# Patient Record
Sex: Male | Born: 1960
Health system: Southern US, Community
[De-identification: ages and names within clinical notes are randomized; demographics above are authoritative.]

## PROBLEM LIST (undated history)

## (undated) DIAGNOSIS — M1990 Unspecified osteoarthritis, unspecified site: Secondary | ICD-10-CM

## (undated) DIAGNOSIS — I10 Essential (primary) hypertension: Secondary | ICD-10-CM

## (undated) DIAGNOSIS — J302 Other seasonal allergic rhinitis: Secondary | ICD-10-CM

## (undated) DIAGNOSIS — Z9889 Other specified postprocedural states: Secondary | ICD-10-CM

## (undated) DIAGNOSIS — N200 Calculus of kidney: Secondary | ICD-10-CM

## (undated) DIAGNOSIS — K649 Unspecified hemorrhoids: Secondary | ICD-10-CM

## (undated) DIAGNOSIS — F32A Depression, unspecified: Secondary | ICD-10-CM

## (undated) DIAGNOSIS — K219 Gastro-esophageal reflux disease without esophagitis: Secondary | ICD-10-CM

## (undated) DIAGNOSIS — I493 Ventricular premature depolarization: Secondary | ICD-10-CM

## (undated) DIAGNOSIS — R112 Nausea with vomiting, unspecified: Secondary | ICD-10-CM

## (undated) DIAGNOSIS — F419 Anxiety disorder, unspecified: Secondary | ICD-10-CM

## (undated) DIAGNOSIS — F329 Major depressive disorder, single episode, unspecified: Secondary | ICD-10-CM

## (undated) HISTORY — DX: Calculus of kidney: N20.0

## (undated) HISTORY — DX: Other seasonal allergic rhinitis: J30.2

## (undated) HISTORY — DX: Essential (primary) hypertension: I10

## (undated) HISTORY — DX: Ventricular premature depolarization: I49.3

## (undated) HISTORY — DX: Unspecified hemorrhoids: K64.9

---

## 1988-09-09 HISTORY — PX: HERNIA REPAIR: SHX51

## 1992-09-09 DIAGNOSIS — N200 Calculus of kidney: Secondary | ICD-10-CM

## 1992-09-09 HISTORY — DX: Calculus of kidney: N20.0

## 1997-09-09 HISTORY — PX: SHOULDER SURGERY: SHX246

## 2001-03-24 ENCOUNTER — Encounter: Admission: RE | Admit: 2001-03-24 | Discharge: 2001-04-08 | Payer: Self-pay | Admitting: *Deleted

## 2001-06-03 ENCOUNTER — Ambulatory Visit: Admission: RE | Admit: 2001-06-03 | Discharge: 2001-09-01 | Payer: Self-pay | Admitting: Radiation Oncology

## 2001-09-09 HISTORY — PX: COLONOSCOPY: SHX174

## 2012-05-21 ENCOUNTER — Other Ambulatory Visit: Payer: Self-pay | Admitting: Orthopedic Surgery

## 2012-05-21 DIAGNOSIS — M25532 Pain in left wrist: Secondary | ICD-10-CM

## 2012-06-01 ENCOUNTER — Ambulatory Visit
Admission: RE | Admit: 2012-06-01 | Discharge: 2012-06-01 | Disposition: A | Discharge status: Home / Self Care | Payer: BC Managed Care – PPO | Source: Ambulatory Visit | Attending: Orthopedic Surgery | Admitting: Orthopedic Surgery

## 2012-06-01 DIAGNOSIS — M25532 Pain in left wrist: Secondary | ICD-10-CM

## 2012-06-01 MED ORDER — IOHEXOL 180 MG/ML  SOLN
3.0000 mL | Freq: Once | INTRAMUSCULAR | Status: AC | PRN
  Administered 2012-06-01: 3 mL via INTRA_ARTICULAR

## 2012-06-25 ENCOUNTER — Encounter: Payer: Self-pay | Admitting: Gastroenterology

## 2012-12-30 ENCOUNTER — Encounter: Payer: Self-pay | Admitting: Gastroenterology

## 2013-01-25 ENCOUNTER — Encounter: Payer: Self-pay | Admitting: Gastroenterology

## 2013-03-04 ENCOUNTER — Encounter: Payer: Self-pay | Admitting: Gastroenterology

## 2013-03-04 ENCOUNTER — Ambulatory Visit (AMBULATORY_SURGERY_CENTER): Payer: 59 | Admitting: *Deleted

## 2013-03-04 VITALS — Ht 70.0 in | Wt 184.2 lb

## 2013-03-04 DIAGNOSIS — Z1211 Encounter for screening for malignant neoplasm of colon: Secondary | ICD-10-CM

## 2013-03-04 MED ORDER — NA SULFATE-K SULFATE-MG SULF 17.5-3.13-1.6 GM/177ML PO SOLN: 1.0000 | Freq: Once | ORAL | Status: DC

## 2013-03-04 NOTE — Progress Notes (Signed)
Denies allergies to eggs or soy products. Denies complications with sedation. States he has had pretty severe nausea with anesthesia.

## 2013-03-19 ENCOUNTER — Encounter: Payer: BC Managed Care – PPO | Admitting: Gastroenterology

## 2013-03-25 ENCOUNTER — Ambulatory Visit (AMBULATORY_SURGERY_CENTER): Payer: 59 | Admitting: Gastroenterology

## 2013-03-25 ENCOUNTER — Encounter: Payer: Self-pay | Admitting: Gastroenterology

## 2013-03-25 VITALS — BP 107/76 | HR 71 | Temp 97.2°F | Resp 13 | Ht 70.0 in | Wt 184.0 lb

## 2013-03-25 DIAGNOSIS — Z1211 Encounter for screening for malignant neoplasm of colon: Secondary | ICD-10-CM

## 2013-03-25 MED ORDER — SODIUM CHLORIDE 0.9 % IV SOLN: 500.0000 mL | INTRAVENOUS | Status: DC

## 2013-03-25 NOTE — Patient Instructions (Addendum)
Discharge instructions given with verbal understanding. Normal exam. Resume previous medications. YOU HAD AN ENDOSCOPIC PROCEDURE TODAY AT THE  ENDOSCOPY CENTER: Refer to the procedure report that was given to you for any specific questions about what was found during the examination.  If the procedure report does not answer your questions, please call your gastroenterologist to clarify.  If you requested that your care partner not be given the details of your procedure findings, then the procedure report has been included in a sealed envelope for you to review at your convenience later.  YOU SHOULD EXPECT: Some feelings of bloating in the abdomen. Passage of more gas than usual.  Walking can help get rid of the air that was put into your GI tract during the procedure and reduce the bloating. If you had a lower endoscopy (such as a colonoscopy or flexible sigmoidoscopy) you may notice spotting of blood in your stool or on the toilet paper. If you underwent a bowel prep for your procedure, then you may not have a normal bowel movement for a few days.  DIET: Your first meal following the procedure should be a light meal and then it is ok to progress to your normal diet.  A half-sandwich or bowl of soup is an example of a good first meal.  Heavy or fried foods are harder to digest and may make you feel nauseous or bloated.  Likewise meals heavy in dairy and vegetables can cause extra gas to form and this can also increase the bloating.  Drink plenty of fluids but you should avoid alcoholic beverages for 24 hours.  ACTIVITY: Your care partner should take you home directly after the procedure.  You should plan to take it easy, moving slowly for the rest of the day.  You can resume normal activity the day after the procedure however you should NOT DRIVE or use heavy machinery for 24 hours (because of the sedation medicines used during the test).    SYMPTOMS TO REPORT IMMEDIATELY: A gastroenterologist  can be reached at any hour.  During normal business hours, 8:30 AM to 5:00 PM Monday through Friday, call [phone].  After hours and on weekends, please call the GI answering service at [phone] who will take a message and have the physician on call contact you.   Following lower endoscopy (colonoscopy or flexible sigmoidoscopy):  Excessive amounts of blood in the stool  Significant tenderness or worsening of abdominal pains  Swelling of the abdomen that is new, acute  Fever of 100F or higher  FOLLOW UP: If any biopsies were taken you will be contacted by phone or by letter within the next 1-3 weeks.  Call your gastroenterologist if you have not heard about the biopsies in 3 weeks.  Our staff will call the home number listed on your records the next business day following your procedure to check on you and address any questions or concerns that you may have at that time regarding the information given to you following your procedure. This is a courtesy call and so if there is no answer at the home number and we have not heard from you through the emergency physician on call, we will assume that you have returned to your regular daily activities without incident.  SIGNATURES/CONFIDENTIALITY: You and/or your care partner have signed paperwork which will be entered into your electronic medical record.  These signatures attest to the fact that that the information above on your After Visit Summary has been reviewed   and is understood.  Full responsibility of the confidentiality of this discharge information lies with you and/or your care-partner. 

## 2013-03-25 NOTE — Progress Notes (Signed)
Patient did not experience any of the following events: a burn prior to discharge; a fall within the facility; wrong site/side/patient/procedure/implant event; or a hospital transfer or hospital admission upon discharge from the facility. (G8907) Patient did not have preoperative order for IV antibiotic SSI prophylaxis. (G8918)  

## 2013-03-25 NOTE — Op Note (Signed)
[facility] [address] [place], [address]   COLONOSCOPY PROCEDURE REPORT  PATIENT: Dylan Marshall, Dylan Marshall.  MR#: [id number] BIRTHDATE: Jun 22, 1961 , 52  yrs. old GENDER: Male ENDOSCOPIST: Louis Meckel, MD REFERRED XB:JYNW Jeanie Sewer, M.D. PROCEDURE DATE:  03/25/2013 PROCEDURE:   Colonoscopy, diagnostic ASA CLASS:   Class I INDICATIONS:Average risk patient for colon cancer. MEDICATIONS: MAC sedation, administered by CRNA and propofol (Diprivan) 200mg  IV  DESCRIPTION OF PROCEDURE:   After the risks benefits and alternatives of the procedure were thoroughly explained, informed consent was obtained.  A digital rectal exam revealed no abnormalities of the rectum.   The LB [id number]  endoscope was introduced through the anus and advanced to the cecum, which was identified by both the appendix and ileocecal valve. No adverse events experienced.   The quality of the prep was excellent using Suprep  The instrument was then slowly withdrawn as the colon was fully examined.      COLON FINDINGS: A normal appearing cecum, ileocecal valve, and appendiceal orifice were identified.  The ascending, hepatic flexure, transverse, splenic flexure, descending, sigmoid colon and rectum appeared unremarkable.  No polyps or cancers were seen. Retroflexed views revealed no abnormalities. The time to cecum=3 minutes 12 seconds.  Withdrawal time=7 minutes 34 seconds.  The scope was withdrawn and the procedure completed. COMPLICATIONS: There were no complications.  ENDOSCOPIC IMPRESSION: Normal colon  RECOMMENDATIONS: Continue current colorectal screening recommendations for "routine risk" patients with a repeat colonoscopy in 10 years.   eSigned:  Louis Meckel, MD 03/25/2013 8:29 AM   cc:

## 2013-03-26 ENCOUNTER — Telehealth: Payer: Self-pay

## 2013-03-26 NOTE — Telephone Encounter (Signed)
Left message on answering machine. 

## 2015-03-22 ENCOUNTER — Other Ambulatory Visit: Payer: Self-pay | Admitting: Orthopaedic Surgery

## 2015-03-22 DIAGNOSIS — M25512 Pain in left shoulder: Secondary | ICD-10-CM

## 2015-04-03 ENCOUNTER — Ambulatory Visit
Admission: RE | Admit: 2015-04-03 | Discharge: 2015-04-03 | Disposition: A | Discharge status: Home / Self Care | Payer: 59 | Source: Ambulatory Visit | Attending: Orthopaedic Surgery | Admitting: Orthopaedic Surgery

## 2015-04-03 ENCOUNTER — Ambulatory Visit
Admission: RE | Admit: 2015-04-03 | Discharge: 2015-04-03 | Disposition: A | Discharge status: Home / Self Care | Payer: Self-pay | Source: Ambulatory Visit | Attending: Orthopaedic Surgery | Admitting: Orthopaedic Surgery

## 2015-04-03 DIAGNOSIS — M25512 Pain in left shoulder: Secondary | ICD-10-CM

## 2015-04-03 MED ORDER — IOHEXOL 180 MG/ML  SOLN
13.0000 mL | Freq: Once | INTRAMUSCULAR | Status: AC | PRN
  Administered 2015-04-03: 13 mL via INTRA_ARTICULAR

## 2015-04-19 ENCOUNTER — Other Ambulatory Visit (HOSPITAL_COMMUNITY): Payer: Self-pay | Admitting: Orthopaedic Surgery

## 2015-04-19 ENCOUNTER — Other Ambulatory Visit (HOSPITAL_BASED_OUTPATIENT_CLINIC_OR_DEPARTMENT_OTHER): Payer: Self-pay | Admitting: Orthopaedic Surgery

## 2015-04-28 ENCOUNTER — Encounter (HOSPITAL_BASED_OUTPATIENT_CLINIC_OR_DEPARTMENT_OTHER): Payer: Self-pay | Admitting: *Deleted

## 2015-05-01 ENCOUNTER — Encounter (HOSPITAL_BASED_OUTPATIENT_CLINIC_OR_DEPARTMENT_OTHER)
Admission: RE | Disposition: A | Discharge status: Home / Self Care | Payer: Self-pay | Source: Ambulatory Visit | Attending: Orthopaedic Surgery

## 2015-05-01 ENCOUNTER — Ambulatory Visit (HOSPITAL_BASED_OUTPATIENT_CLINIC_OR_DEPARTMENT_OTHER)
Admission: RE | Admit: 2015-05-01 | Discharge: 2015-05-01 | Disposition: A | Discharge status: Home / Self Care | Payer: 59 | Source: Ambulatory Visit | Attending: Orthopaedic Surgery | Admitting: Orthopaedic Surgery

## 2015-05-01 ENCOUNTER — Ambulatory Visit (HOSPITAL_BASED_OUTPATIENT_CLINIC_OR_DEPARTMENT_OTHER): Payer: 59 | Admitting: Anesthesiology

## 2015-05-01 ENCOUNTER — Encounter (HOSPITAL_BASED_OUTPATIENT_CLINIC_OR_DEPARTMENT_OTHER): Payer: Self-pay

## 2015-05-01 DIAGNOSIS — X58XXXA Exposure to other specified factors, initial encounter: Secondary | ICD-10-CM | POA: Insufficient documentation

## 2015-05-01 DIAGNOSIS — Z87891 Personal history of nicotine dependence: Secondary | ICD-10-CM | POA: Diagnosis not present

## 2015-05-01 DIAGNOSIS — F419 Anxiety disorder, unspecified: Secondary | ICD-10-CM | POA: Insufficient documentation

## 2015-05-01 DIAGNOSIS — M94212 Chondromalacia, left shoulder: Secondary | ICD-10-CM | POA: Diagnosis not present

## 2015-05-01 DIAGNOSIS — M75112 Incomplete rotator cuff tear or rupture of left shoulder, not specified as traumatic: Secondary | ICD-10-CM | POA: Insufficient documentation

## 2015-05-01 DIAGNOSIS — F329 Major depressive disorder, single episode, unspecified: Secondary | ICD-10-CM | POA: Diagnosis not present

## 2015-05-01 DIAGNOSIS — S43432A Superior glenoid labrum lesion of left shoulder, initial encounter: Secondary | ICD-10-CM | POA: Diagnosis not present

## 2015-05-01 DIAGNOSIS — K219 Gastro-esophageal reflux disease without esophagitis: Secondary | ICD-10-CM | POA: Diagnosis not present

## 2015-05-01 DIAGNOSIS — S43492A Other sprain of left shoulder joint, initial encounter: Secondary | ICD-10-CM | POA: Diagnosis present

## 2015-05-01 DIAGNOSIS — M19071 Primary osteoarthritis, right ankle and foot: Secondary | ICD-10-CM | POA: Insufficient documentation

## 2015-05-01 DIAGNOSIS — Z87442 Personal history of urinary calculi: Secondary | ICD-10-CM | POA: Diagnosis not present

## 2015-05-01 DIAGNOSIS — I10 Essential (primary) hypertension: Secondary | ICD-10-CM | POA: Insufficient documentation

## 2015-05-01 DIAGNOSIS — M19072 Primary osteoarthritis, left ankle and foot: Secondary | ICD-10-CM | POA: Diagnosis not present

## 2015-05-01 DIAGNOSIS — S46119A Strain of muscle, fascia and tendon of long head of biceps, unspecified arm, initial encounter: Secondary | ICD-10-CM

## 2015-05-01 HISTORY — DX: Major depressive disorder, single episode, unspecified: F32.9

## 2015-05-01 HISTORY — DX: Unspecified osteoarthritis, unspecified site: M19.90

## 2015-05-01 HISTORY — DX: Depression, unspecified: F32.A

## 2015-05-01 HISTORY — DX: Other specified postprocedural states: Z98.890

## 2015-05-01 HISTORY — PX: SHOULDER ARTHROSCOPY WITH BICEPS TENDON REPAIR: SHX5674

## 2015-05-01 HISTORY — DX: Other specified postprocedural states: R11.2

## 2015-05-01 HISTORY — DX: Anxiety disorder, unspecified: F41.9

## 2015-05-01 HISTORY — DX: Gastro-esophageal reflux disease without esophagitis: K21.9

## 2015-05-01 LAB — POCT I-STAT, CHEM 8
BUN: 19 mg/dL (ref 6–20)
CHLORIDE: 107 mmol/L (ref 101–111)
CREATININE: 0.9 mg/dL (ref 0.61–1.24)
Calcium, Ion: 1.07 mmol/L — ABNORMAL LOW (ref 1.12–1.23)
Glucose, Bld: 148 mg/dL — ABNORMAL HIGH (ref 65–99)
HEMATOCRIT: 41 % (ref 39.0–52.0)
Hemoglobin: 13.9 g/dL (ref 13.0–17.0)
Potassium: 3.8 mmol/L (ref 3.5–5.1)
Sodium: 136 mmol/L (ref 135–145)
TCO2: 23 mmol/L (ref 0–100)

## 2015-05-01 SURGERY — SHOULDER ARTHROSCOPY WITH BICEPS TENDON REPAIR
Anesthesia: General | Site: Shoulder | Laterality: Left

## 2015-05-01 MED ORDER — SUCCINYLCHOLINE CHLORIDE 20 MG/ML IJ SOLN
INTRAMUSCULAR | Status: DC | PRN
  Administered 2015-05-01: 50 mg via INTRAVENOUS

## 2015-05-01 MED ORDER — LACTATED RINGERS IV SOLN
INTRAVENOUS | Status: DC
  Administered 2015-05-01 (×3): via INTRAVENOUS

## 2015-05-01 MED ORDER — DEXAMETHASONE SODIUM PHOSPHATE 4 MG/ML IJ SOLN
INTRAMUSCULAR | Status: DC | PRN
  Administered 2015-05-01: 10 mg via INTRAVENOUS

## 2015-05-01 MED ORDER — MIDAZOLAM HCL 2 MG/2ML IJ SOLN
1.0000 mg | INTRAMUSCULAR | Status: DC | PRN
  Administered 2015-05-01: 2 mg via INTRAVENOUS

## 2015-05-01 MED ORDER — FENTANYL CITRATE (PF) 100 MCG/2ML IJ SOLN
50.0000 ug | INTRAMUSCULAR | Status: DC | PRN
  Administered 2015-05-01: 100 ug via INTRAVENOUS

## 2015-05-01 MED ORDER — SCOPOLAMINE 1 MG/3DAYS TD PT72
MEDICATED_PATCH | TRANSDERMAL | Status: AC
  Filled 2015-05-01: qty 1

## 2015-05-01 MED ORDER — HYDROMORPHONE HCL 2 MG PO TABS: 2.0000 mg | ORAL_TABLET | ORAL | Status: DC | PRN

## 2015-05-01 MED ORDER — BUPIVACAINE-EPINEPHRINE 0.5% -1:200000 IJ SOLN
INTRAMUSCULAR | Status: DC | PRN
  Administered 2015-05-01: 10 mL

## 2015-05-01 MED ORDER — CHLORHEXIDINE GLUCONATE 4 % EX LIQD: 60.0000 mL | Freq: Once | CUTANEOUS | Status: DC

## 2015-05-01 MED ORDER — GLYCOPYRROLATE 0.2 MG/ML IJ SOLN: 0.2000 mg | Freq: Once | INTRAMUSCULAR | Status: DC | PRN

## 2015-05-01 MED ORDER — METHOCARBAMOL 500 MG PO TABS: 500.0000 mg | ORAL_TABLET | Freq: Four times a day (QID) | ORAL | Status: DC | PRN

## 2015-05-01 MED ORDER — FENTANYL CITRATE (PF) 100 MCG/2ML IJ SOLN
INTRAMUSCULAR | Status: DC | PRN
  Administered 2015-05-01: 100 ug via INTRAVENOUS

## 2015-05-01 MED ORDER — LIDOCAINE HCL (CARDIAC) 20 MG/ML IV SOLN
INTRAVENOUS | Status: DC | PRN
  Administered 2015-05-01: 50 mg via INTRAVENOUS

## 2015-05-01 MED ORDER — PROPOFOL 10 MG/ML IV BOLUS
INTRAVENOUS | Status: DC | PRN
  Administered 2015-05-01: 200 mg via INTRAVENOUS

## 2015-05-01 MED ORDER — BUPIVACAINE-EPINEPHRINE (PF) 0.5% -1:200000 IJ SOLN
INTRAMUSCULAR | Status: AC
  Filled 2015-05-01: qty 30

## 2015-05-01 MED ORDER — EPHEDRINE SULFATE 50 MG/ML IJ SOLN
INTRAMUSCULAR | Status: DC | PRN
  Administered 2015-05-01 (×2): 10 mg via INTRAVENOUS

## 2015-05-01 MED ORDER — OXYCODONE-ACETAMINOPHEN 5-325 MG PO TABS: 2.0000 | ORAL_TABLET | ORAL | Status: DC | PRN

## 2015-05-01 MED ORDER — CEFAZOLIN SODIUM-DEXTROSE 2-3 GM-% IV SOLR
INTRAVENOUS | Status: DC | PRN
  Administered 2015-05-01: 2 g via INTRAVENOUS

## 2015-05-01 MED ORDER — MIDAZOLAM HCL 2 MG/2ML IJ SOLN
INTRAMUSCULAR | Status: AC
  Filled 2015-05-01: qty 2

## 2015-05-01 MED ORDER — FENTANYL CITRATE (PF) 100 MCG/2ML IJ SOLN
INTRAMUSCULAR | Status: AC
  Filled 2015-05-01: qty 2

## 2015-05-01 MED ORDER — FENTANYL CITRATE (PF) 100 MCG/2ML IJ SOLN
INTRAMUSCULAR | Status: AC
  Filled 2015-05-01: qty 6

## 2015-05-01 MED ORDER — SCOPOLAMINE 1 MG/3DAYS TD PT72
1.0000 | MEDICATED_PATCH | Freq: Once | TRANSDERMAL | Status: DC | PRN
  Administered 2015-05-01: 1.5 mg via TRANSDERMAL

## 2015-05-01 MED ORDER — ONDANSETRON HCL 4 MG/2ML IJ SOLN
INTRAMUSCULAR | Status: DC | PRN
  Administered 2015-05-01 (×2): 4 mg via INTRAVENOUS

## 2015-05-01 MED ORDER — MIDAZOLAM HCL 5 MG/5ML IJ SOLN
INTRAMUSCULAR | Status: DC | PRN
  Administered 2015-05-01: 2 mg via INTRAVENOUS

## 2015-05-01 MED ORDER — PHENYLEPHRINE HCL 10 MG/ML IJ SOLN
INTRAMUSCULAR | Status: DC | PRN
  Administered 2015-05-01 (×2): 40 ug via INTRAVENOUS

## 2015-05-01 MED ORDER — SODIUM CHLORIDE 0.9 % IR SOLN
Status: DC | PRN
  Administered 2015-05-01: 3000 mL

## 2015-05-01 SURGICAL SUPPLY — 66 items
BLADE CUTTER GATOR 3.5 (BLADE) ×3 IMPLANT
BLADE GREAT WHITE 4.2 (BLADE) IMPLANT
BLADE VORTEX 6.0 (BLADE) IMPLANT
BUR OVAL 4.0 (BURR) IMPLANT
BUR OVAL 6.0 (BURR) IMPLANT
CANNULA 5.75X71 LONG (CANNULA) ×3 IMPLANT
CANNULA TWIST IN 8.25X7CM (CANNULA) IMPLANT
CANNULA TWIST IN 8.25X9CM (CANNULA) IMPLANT
DECANTER SPIKE VIAL GLASS SM (MISCELLANEOUS) IMPLANT
DRAPE SHOULDER BEACH CHAIR (DRAPES) ×3 IMPLANT
DRAPE U 20/CS (DRAPES) ×3 IMPLANT
DRAPE U-SHAPE 47X51 STRL (DRAPES) ×3 IMPLANT
DRSG PAD ABDOMINAL 8X10 ST (GAUZE/BANDAGES/DRESSINGS) ×3 IMPLANT
DURAPREP 26ML APPLICATOR (WOUND CARE) ×3 IMPLANT
ELECT REM PT RETURN 9FT ADLT (ELECTROSURGICAL) ×3
ELECTRODE REM PT RTRN 9FT ADLT (ELECTROSURGICAL) ×2 IMPLANT
FIBERSTICK 2 (SUTURE) IMPLANT
GAUZE SPONGE 4X4 12PLY STRL (GAUZE/BANDAGES/DRESSINGS) ×3 IMPLANT
GLOVE BIO SURGEON STRL SZ8 (GLOVE) ×3 IMPLANT
GLOVE BIOGEL PI IND STRL 8 (GLOVE) ×4 IMPLANT
GLOVE BIOGEL PI INDICATOR 8 (GLOVE) ×2
GLOVE ORTHO TXT STRL SZ7.5 (GLOVE) ×3 IMPLANT
GOWN STRL REUS W/ TWL LRG LVL3 (GOWN DISPOSABLE) ×2 IMPLANT
GOWN STRL REUS W/ TWL XL LVL3 (GOWN DISPOSABLE) ×4 IMPLANT
GOWN STRL REUS W/TWL LRG LVL3 (GOWN DISPOSABLE) ×1
GOWN STRL REUS W/TWL XL LVL3 (GOWN DISPOSABLE) ×2
IMMOBILIZER SHOULDER FOAM XLGE (SOFTGOODS) IMPLANT
IV NS IRRIG 3000ML ARTHROMATIC (IV SOLUTION) ×6 IMPLANT
KIT BIO-TENODESIS 3X8 DISP (MISCELLANEOUS) ×1
KIT INSRT BABSR STRL DISP BTN (MISCELLANEOUS) ×2 IMPLANT
KIT PUSHLOCK 2.9 HIP (KITS) IMPLANT
KIT SHOULDER TRACTION (DRAPES) IMPLANT
LASSO 90 CVE QUICKPAS (DISPOSABLE) IMPLANT
MANIFOLD NEPTUNE II (INSTRUMENTS) ×3 IMPLANT
NDL SUT 6 .5 CRC .975X.05 MAYO (NEEDLE) IMPLANT
NEEDLE MAYO TAPER (NEEDLE)
NEEDLE SCORPION MULTI FIRE (NEEDLE) IMPLANT
PACK ARTHROSCOPY DSU (CUSTOM PROCEDURE TRAY) ×3 IMPLANT
PACK BASIN DAY SURGERY FS (CUSTOM PROCEDURE TRAY) ×3 IMPLANT
SCREW INTERFERENCE 7X25MM (Screw) IMPLANT
SCREW PEEK TENODESIS 7X23M (Screw) ×3 IMPLANT
SET ARTHROSCOPY TUBING (MISCELLANEOUS) ×1
SET ARTHROSCOPY TUBING LN (MISCELLANEOUS) ×2 IMPLANT
SLEEVE SCD COMPRESS KNEE MED (MISCELLANEOUS) ×3 IMPLANT
SLING ARM IMMOBILIZER LRG (SOFTGOODS) IMPLANT
SLING ARM IMMOBILIZER MED (SOFTGOODS) IMPLANT
SLING ARM LRG ADULT FOAM STRAP (SOFTGOODS) IMPLANT
SLING ARM MED ADULT FOAM STRAP (SOFTGOODS) IMPLANT
SLING ARM XL FOAM STRAP (SOFTGOODS) IMPLANT
SUT ETHILON 4 0 PS 2 18 (SUTURE) ×3 IMPLANT
SUT FIBERWIRE #2 38 T-5 BLUE (SUTURE) ×6
SUT MNCRL AB 4-0 PS2 18 (SUTURE) IMPLANT
SUT PDS AB 1 CT  36 (SUTURE)
SUT PDS AB 1 CT 36 (SUTURE) IMPLANT
SUT TIGER TAPE 7 IN WHITE (SUTURE) IMPLANT
SUT VIC AB 2-0 SH 27 (SUTURE)
SUT VIC AB 2-0 SH 27XBRD (SUTURE) IMPLANT
SUT VIC AB 3-0 SH 27 (SUTURE)
SUT VIC AB 3-0 SH 27X BRD (SUTURE) IMPLANT
SUTURE FIBERWR #2 38 T-5 BLUE (SUTURE) ×4 IMPLANT
TAPE FIBER 2MM 7IN #2 BLUE (SUTURE) IMPLANT
TAPE LABRALWHITE 1.5X36 (TAPE) IMPLANT
TAPE SUT LABRALTAP WHT/BLK (SUTURE) IMPLANT
TOWEL OR 17X24 6PK STRL BLUE (TOWEL DISPOSABLE) ×3 IMPLANT
TOWEL OR NON WOVEN STRL DISP B (DISPOSABLE) ×3 IMPLANT
WATER STERILE IRR 1000ML POUR (IV SOLUTION) ×3 IMPLANT

## 2015-05-01 NOTE — Discharge Instructions (Signed)
Remove dressing at 24 hrs OK to shower, leave steri strips on . Apply large bandaid over incision. No pulling left arm. Ice on and off for 24 to 48 hrs as needed. Call for fever , increased drainage. See Dr. Lorin Mercy Next Tuesday or Wednesday in the office. Call for appt. If not already scheduled.    Office [phone].

## 2015-05-01 NOTE — Anesthesia Postprocedure Evaluation (Signed)
  Anesthesia Post-op Note  Patient: Dylan Marshall  Procedure(s) Performed: Procedure(s): SHOULDER ARTHROSCOPY debride labrum WITH BICEPS TENDON REPAIR (Left)  Patient Location: PACU  Anesthesia Type:General  Level of Consciousness: awake  Airway and Oxygen Therapy: Patient Spontanous Breathing  Post-op Pain: none  Post-op Assessment: Post-op Vital signs reviewed              Post-op Vital Signs: Reviewed  Last Vitals:  Filed Vitals:   05/01/15 1045  BP:   Pulse: 71  Temp: 36.7 C  Resp: 16    Complications: No apparent anesthesia complications

## 2015-05-01 NOTE — H&P (Signed)
Dylan Marshall is an 54 y.o. male.    A 53 year old white male with history of left shoulder pain returns for review of his left shoulder MRI scan that was done on 04/03/2015.  Report read prominence area of the superior labrum that extends from the posterior superior labrum into the anterior superior labrum.  It extends into the biceps anchor, but not into the biceps tendon itself.  There is an anterior glenoid anchor noted.  Mild rotator cuff tendinopathy without rotator cuff tear.  Mild degenerative AC joint arthropathy.  Mild chondral thinning along the upper glenoid.  He states that his shoulder pain is unchanged.  No true feeling of instability currently.     Past Medical History  Diagnosis Date  . Seasonal allergies   . Hypertension   . PVC's (premature ventricular contractions)     workup shows benign per patient  . Hemorrhoid   . Kidney stone 1994  . Depression   . Anxiety   . GERD (gastroesophageal reflux disease)   . Arthritis     feet  . PONV (postoperative nausea and vomiting)     Past Surgical History  Procedure Laterality Date  . Shoulder surgery Left 1999  . Colonoscopy  2003  . Hernia repair Left 1990    Family History  Problem Relation Age of Onset  . Colon cancer Neg Hx   . Esophageal cancer Neg Hx   . Rectal cancer Neg Hx   . Stomach cancer Neg Hx    Social History:  reports that he has never smoked. He quit smokeless tobacco use about 32 years ago. His smokeless tobacco use included Chew. He reports that he drinks about 2.4 - 3.0 oz of alcohol per week. He reports that he does not use illicit drugs.  Allergies:  Allergies  Allergen Reactions  . Codeine Nausea Only  . Erythromycin Nausea Only  . Sulfa Antibiotics Nausea Only  . Adhesive [Tape] Rash    No prescriptions prior to admission    No results found for this or any previous visit (from the past 69 hour(s)). No results found.  Review of Systems  Constitutional: Negative.   HENT:  Negative.   Eyes: Negative.   Respiratory: Negative.   Cardiovascular: Negative.   Genitourinary: Negative.   Musculoskeletal: Positive for joint pain.  Skin: Negative.   Neurological: Negative.   Psychiatric/Behavioral: Negative.     Height 5\' 9"  (1.753 m), weight 81.647 kg (180 lb). Physical Exam  Constitutional: He is oriented to person, place, and time. He appears well-developed.  HENT:  Head: Normocephalic.  Neck: Normal range of motion.  Cardiovascular: Normal rate.   Respiratory: Effort normal. No respiratory distress.  GI: He exhibits no distension.  Neurological: He is alert and oriented to person, place, and time.  Skin: Skin is warm and dry.  Psychiatric: He has a normal mood and affect.    PHYSICAL EXAMINATION:  Pleasant white male, alert and oriented x3 and in no acute distress.  Head is normocephalic, atraumatic.  No increase in respiratory effort.  Extraocular movements intact.  Oral mucosa pink and moist.  Abdomen:  Round, nondistended.  Left shoulder:  He has good range of motion.  Positive impingement test.  Positive O'Brien test.  Negative drop arm.  He is neurovascularly intact.  Skin:  Warm and dry.   ASSESSMENT:  Left shoulder pain secondary to SLAP tear.   PLAN:  Patient understands that our best treatment option at this point would be left shoulder  arthroscopy or debridement and arthroscopic assisted SLAP repair versus open biceps tenodesis.  Both procedures discussed with patient and his wife who is present in great detail, along with potential rehab/recovery time.  Dr. Lorin Mercy also did speak with patient.  Pre-op paperwork filled out.  He wishes to have this done sometime in the near future.  All questions answered. Dylan Marshall M 05/01/2015, 4:11 AM

## 2015-05-01 NOTE — Op Note (Signed)
NAME:  Dylan Marshall, Dylan Marshall NO.:  [id number]  MEDICAL RECORD NO.:  [id number]  LOCATION:                                 FACILITY:  PHYSICIAN:  Laqueena Hinchey C. Lorin Mercy, M.D.         DATE OF BIRTH:  DATE OF PROCEDURE:  05/01/2015 DATE OF DISCHARGE:                              OPERATIVE REPORT   PREOPERATIVE DIAGNOSES:  Left shoulder superior labral type 2 tear. Previous anterior labral repair in 1999 for instability.  POSTOPERATIVE DIAGNOSES:  Left shoulder superior labral type 2 tear. Previous anterior labral repair in 1999 for instability.  PROCEDURES:  Diagnostic and operative arthroscopy, left shoulder. Arthroscopic debridement of superior labral tear.  Limited chondroplasty of glenoid centrally.  Biceps tendon debridement and release. Undersurface of partial rotator cuff debridement.  Open biceps tenodesis.  7 x 23-mm bio-tenodesis PEEK screw.  SURGEON:  Keithen Capo C. Lorin Mercy, M.D.  ANESTHESIA:  Preoperative block plus general plus local Marcaine.  ESTIMATED BLOOD LOSS:  Minimal.  INDICATIONS:  This 54 year old male who had previous shoulder subluxation, had previous anterior capsulorrhaphy and labral repair with anchor fixation anteriorly.  He developed progressive increased pain and points to have trouble with overhead activity throwing.  He throws with his left hand, pain with outstretched reaching, pulling anteriorly over the biceps tendon, had previous conservative treatment including therapy, exercises, anti-inflammatories and injection.  Preoperative MRI scan showed type 2 SLAP tear without torn biceps tendon.  Operative plan was SLAP repair with concern for the previous anchors placed anteriorly versus biceps tenodesis for this patient at age 66.  DESCRIPTION OF PROCEDURE:  After induction of preoperative block, induction of general anesthesia, beach-chair position, Ancef prophylaxis, time-out procedure, standard prepping and draping was performed, impervious  stockinette, Coban.  Arthroscope was introduced through the old stab incision.  Posteriorly, anterior portal was made inferior to the biceps tendon.  Careful inspection of the joint showed suture still placed and the labrum repaired, stuck to the bone anteriorly and no evidence of recurrent Bankart lesion.  External rotation showed no anterior instability, did have some chondral wear in the midportion of the glenoid centrally and this was trimmed from the anterior portal with the Cuda shaver.  Undersurface of the rotator cuff showed some partial tearing.  This was debrided and this appeared only be 10-20% of the thickness of the rotator cuff and corresponded with the changes on the MRI scan preoperatively, which was on the large marked screen visualized during the surgery.  Anterior superior labrum showed shredding and complex tearing both radial and horizontal components. There was detachment of the labrum that extended past 11 o'clock position.  Posterior labrum below that down to 6 o'clock was secure.  No posterior instability and no inferior instability.  The patient still had the old Hill-Sachs lesion from previous subluxation episodes.  Today, anterior portal shaver was used for debridement of the superior labral tear.  Biceps tendon was released with large straight flat baskets directly off the superior labrum.  Labrum was trimmed.  Biceps tendon was fully released.  Careful inspection of the joint showed no remaining areas of pathology needed to be addressed.  There was mild wear on the  humeral head with some areas of grade 2 chondromalacia, no areas of grade 3.  Shoulder was suctioned dry.  Skin marker was used anteriorly, 2-0 nylon was placed in the skin closing the portals. Incision was made anteriorly directly over the palpable biceps tendon. Self-retaining retractor was placed.  The tendon was hooked with a right angle clamp from medial to lateral, delivered, 20 mm resected.   Bunnell weave #2 FiberWire placed.  All drilled in the bicipital groove, overreamed to 7 mm based on sizing and using the large screwdriver tacked and 10 was pushed down to the hole and secured with screw. Initially, partial portion of it entered inappropriately, screw was backed out some, pulling on the suture.  The tendon was advanced and then re-securement bearing the tendon inside the hole with good securement.  Two ends of the suture were tied down, locking down to repair, all reached its full extension.  Operative field was dry, irrigated.  The 2-0 Vicryl in the subcutaneous tissue, 4-0 Vicryl in the subcuticular closure.  Tincture of benzoin, Steri-Strips, Tegaderm, 4x4s, tape and sling was applied.  The patient tolerated the procedure well.  Outpatient surgery was appropriate for treatment of this condition, also follow up in 1 week.     Micco Bourbeau C. Lorin Mercy, M.D.     MCY/MEDQ  D:  05/01/2015  T:  05/01/2015  Job:  [id number]

## 2015-05-01 NOTE — Anesthesia Preprocedure Evaluation (Addendum)
Anesthesia Evaluation  Patient identified by MRN, date of birth, ID band Patient awake    Reviewed: Allergy & Precautions, NPO status   History of Anesthesia Complications (+) PONV  Airway Mallampati: II  TM Distance: >3 FB Neck ROM: Full    Dental   Pulmonary neg pulmonary ROS,  breath sounds clear to auscultation        Cardiovascular hypertension, Rhythm:Regular Rate:Normal     Neuro/Psych    GI/Hepatic Neg liver ROS, GERD-  ,  Endo/Other    Renal/GU Renal disease     Musculoskeletal   Abdominal   Peds  Hematology   Anesthesia Other Findings   Reproductive/Obstetrics                            Anesthesia Physical Anesthesia Plan  ASA: III  Anesthesia Plan: General and Regional   Post-op Pain Management: MAC Combined w/ Regional for Post-op pain and GA combined w/ Regional for post-op pain   Induction: Intravenous  Airway Management Planned: Oral ETT  Additional Equipment:   Intra-op Plan:   Post-operative Plan: Extubation in OR  Informed Consent: I have reviewed the patients History and Physical, chart, labs and discussed the procedure including the risks, benefits and alternatives for the proposed anesthesia with the patient or authorized representative who has indicated his/her understanding and acceptance.   Dental advisory given  Plan Discussed with: CRNA and Anesthesiologist  Anesthesia Plan Comments:        Anesthesia Quick Evaluation

## 2015-05-01 NOTE — Brief Op Note (Addendum)
05/01/2015  9:04 AM  PATIENT:  Dylan Marshall  54 y.o. male  PRE-OPERATIVE DIAGNOSIS:  Left Shoulder (SLAP) Tear, superior labral tear from anterior to posterior   POST-OPERATIVE DIAGNOSIS:  left shoulder superior labral tear from anterior to posterior  PROCEDURE:  Procedure(s): SHOULDER ARTHROSCOPY WITH BICEPS TENDON REPAIR (Left) superior labral tear debridement, limited chondroplasty glenoid, biceps tendon debridement, undersurface partial rotator cuff debridement. Open biceps tenodesis.   7X 46mm biotenodesis screw.   SURGEON:  Surgeon(s) and Role:    * Marybelle Killings, MD - Primary  PHYSICIAN ASSISTANT:   ASSISTANTS: none   ANESTHESIA:   local, regional and general  EBL:  Total I/O In: 2000 [I.V.:2000] Out: -   BLOOD ADMINISTERED:none  DRAINS: none   LOCAL MEDICATIONS USED:  MARCAINE     SPECIMEN:  No Specimen  DISPOSITION OF SPECIMEN:  N/A  COUNTS:  YES  TOURNIQUET:  * No tourniquets in log *  DICTATION: .Other Dictation: Dictation Number [id number]  PLAN OF CARE: Discharge to home after PACU  PATIENT DISPOSITION:  PACU - hemodynamically stable.   Delay start of Pharmacological VTE agent (>24hrs) due to surgical blood loss or risk of bleeding: not applicable

## 2015-05-01 NOTE — Transfer of Care (Signed)
Immediate Anesthesia Transfer of Care Note  Patient: Dylan Marshall  Procedure(s) Performed: Procedure(s): SHOULDER ARTHROSCOPY debride labrum WITH BICEPS TENDON REPAIR (Left)  Patient Location: PACU  Anesthesia Type:GA combined with regional for post-op pain  Level of Consciousness: awake  Airway & Oxygen Therapy: Patient Spontanous Breathing and Patient connected to face mask oxygen  Post-op Assessment: Report given to RN and Post -op Vital signs reviewed and stable  Post vital signs: Reviewed and stable  Last Vitals:  Filed Vitals:   05/01/15 0730  BP: 133/82  Pulse: 91  Temp:   Resp: 14    Complications: No apparent anesthesia complications

## 2015-05-01 NOTE — Progress Notes (Signed)
Assisted Dr. Oletta Lamas with left, ultrasound guided, supraclavicular block. Side rails up, monitors on throughout procedure. See vital signs in flow sheet. Tolerated Procedure well.

## 2015-05-01 NOTE — Interval H&P Note (Signed)
History and Physical Interval Note:  05/01/2015 7:22 AM  Dylan Marshall  has presented today for surgery, with the diagnosis of Left Shoulder (SLAP) Tear, superior labral tear from anterior to posterior   The various methods of treatment have been discussed with the patient and family. After consideration of risks, benefits and other options for treatment, the patient has consented to  Procedure(s): Left Shoulder Arthroscopy, Superior Labrum Anterior and Posterior. (SLAP) Repair vs. Tenodesis (Left) as a surgical intervention .  The patient's history has been reviewed, patient examined, no change in status, stable for surgery.  I have reviewed the patient's chart and labs.  Questions were answered to the patient's satisfaction.     Aemilia Dedrick C

## 2015-05-01 NOTE — Interval H&P Note (Signed)
History and Physical Interval Note:  05/01/2015 7:22 AM  Dylan Marshall  has presented today for surgery, with the diagnosis of Left Shoulder (SLAP) Tear, superior labral tear from anterior to posterior   The various methods of treatment have been discussed with the patient and family. After consideration of risks, benefits and other options for treatment, the patient has consented to  Procedure(s): Left Shoulder Arthroscopy, Superior Labrum Anterior and Posterior. (SLAP) Repair vs. Tenodesis (Left) as a surgical intervention .  The patient's history has been reviewed, patient examined, no change in status, stable for surgery.  I have reviewed the patient's chart and labs.  Questions were answered to the patient's satisfaction.     [name] C

## 2015-05-01 NOTE — Anesthesia Procedure Notes (Addendum)
Procedure Name: Intubation Date/Time: 05/01/2015 7:40 AM Performed by: Marrianne Mood Pre-anesthesia Checklist: Patient identified, Emergency Drugs available, Suction available, Patient being monitored and Timeout performed Patient Re-evaluated:Patient Re-evaluated prior to inductionOxygen Delivery Method: Circle System Utilized Preoxygenation: Pre-oxygenation with 100% oxygen Intubation Type: IV induction Ventilation: Mask ventilation without difficulty Laryngoscope Size: Miller and 3 Tube type: Oral Tube size: 8.0 mm Number of attempts: 1 Airway Equipment and Method: Stylet and Oral airway Placement Confirmation: ETT inserted through vocal cords under direct vision,  positive ETCO2 and breath sounds checked- equal and bilateral Secured at: 22 cm Tube secured with: Tape Dental Injury: Teeth and Oropharynx as per pre-operative assessment    Anesthesia Regional Block:  Interscalene brachial plexus block  Pre-Anesthetic Checklist: ,, timeout performed, Correct Patient, Correct Site, Correct Laterality, Correct Procedure, Correct Position, site marked, Risks and benefits discussed,  Surgical consent,  Pre-op evaluation,  At surgeon's request and post-op pain management  Laterality: Left  Prep: chloraprep       Needles:  Injection technique: Single-shot  Needle Type: Stimulator Needle - 40          Additional Needles:  Procedures: Doppler guided, ultrasound guided (picture in chart) and nerve stimulator Interscalene brachial plexus block  Nerve Stimulator or Paresthesia:  Response: 0.5 mA,   Additional Responses:   Narrative:  Start time: 05/01/2015 7:00 AM End time: 05/01/2015 7:15 AM Injection made incrementally with aspirations every 5 mL.  Performed by: Personally  Anesthesiologist: Finis Bud

## 2015-05-03 ENCOUNTER — Encounter (HOSPITAL_BASED_OUTPATIENT_CLINIC_OR_DEPARTMENT_OTHER): Payer: Self-pay | Admitting: Orthopaedic Surgery

## 2016-01-10 IMAGING — MR MR SHOULDER*L* W/ CM
4 of 6 series · 18 of 40 positions shown · IV contrast (agent unspecified)
Comparison: Injection image from 04/03/15

CLINICAL DATA: Left shoulder pain. Reduced range of motion for 6
months. Shoulder surgery in 1555.

EXAM:
MR ARTHROGRAM OF THE left SHOULDER
TECHNIQUE: Multiplanar, multisequence MR imaging of the left shoulder was
performed following the administration of intra-articular contrast.
CONTRAST:  See Injection Documentation.

[Series 3: left (id) fs · axial · 4.0mm · 0.20mm/px · z∈[-67,+4]mm · 3 of 20 slices shown]
[im 4/20]
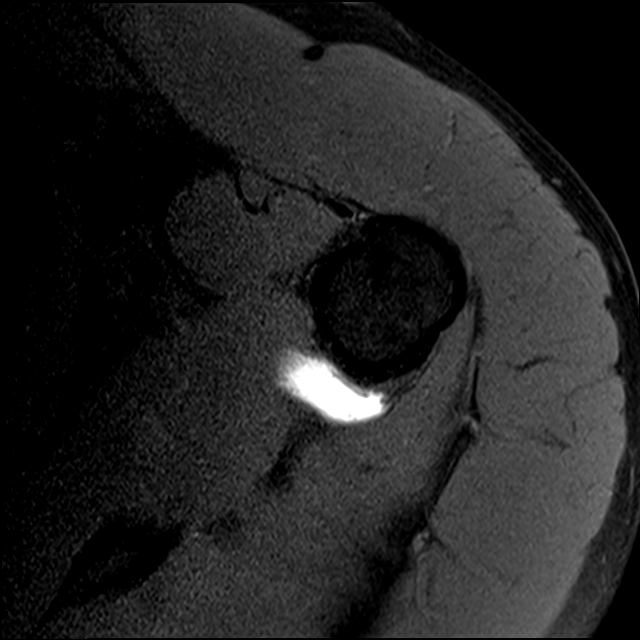
[im 12/20]
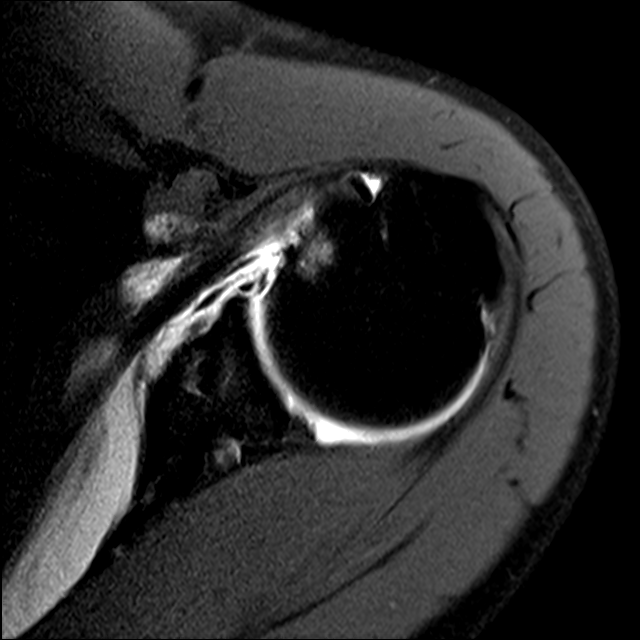
[im 20/20]
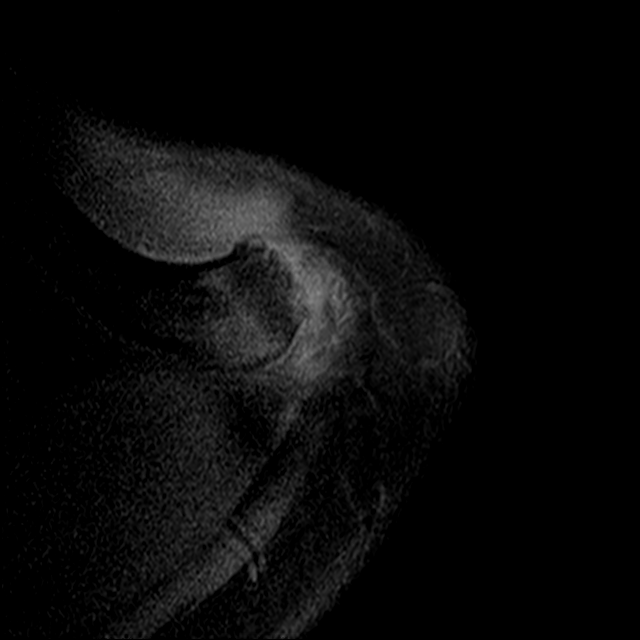

[Series 5: T1 fat-sat · oblique · 4.0mm · 0.22mm/px · 5 of 18 slices shown]
[im 1/18]
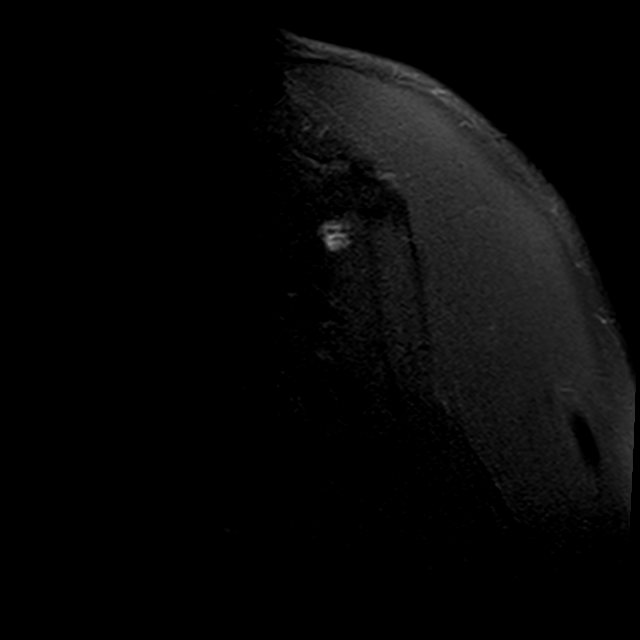
[im 3/18]
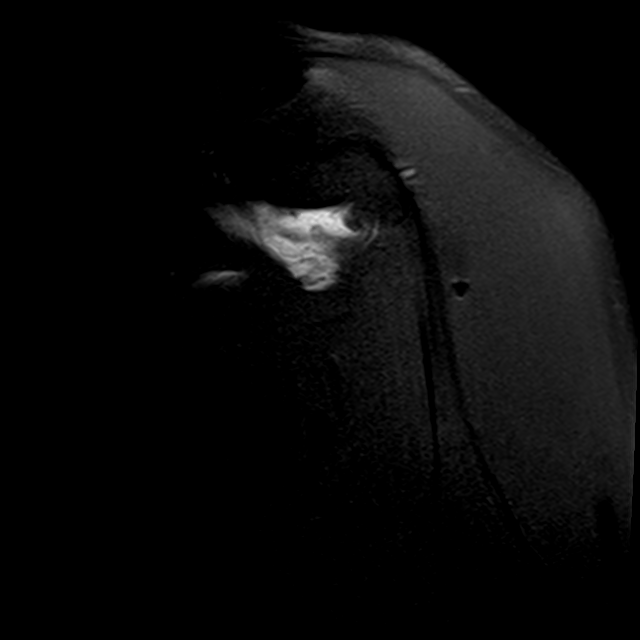
[im 6/18]
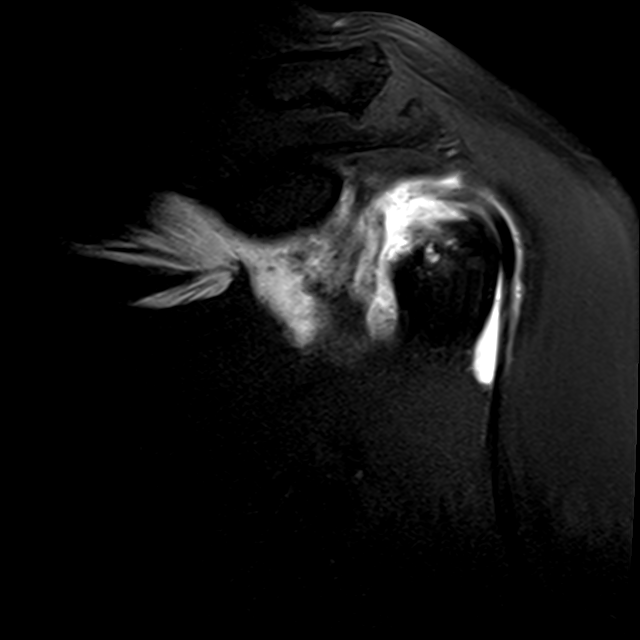
[im 9/18]
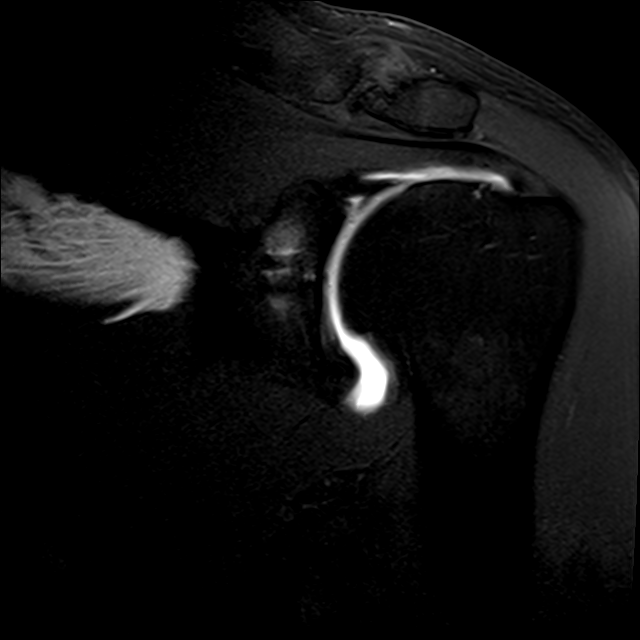
[im 15/18]
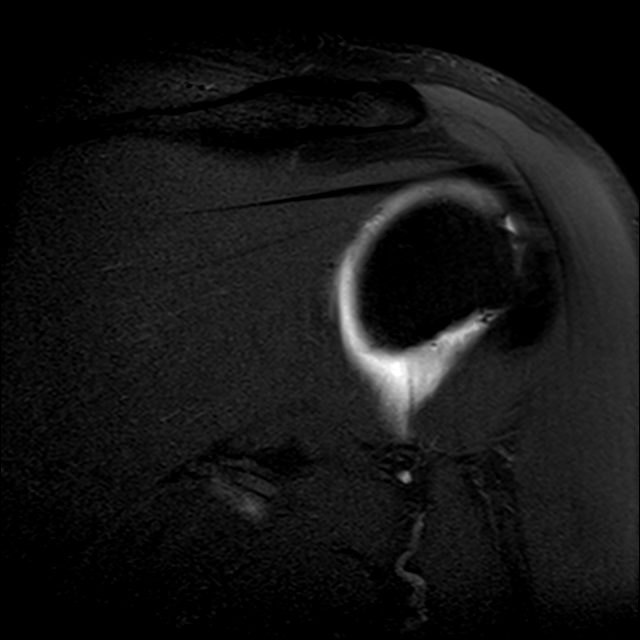

[Series 6: T1 · oblique · 4.0mm · 0.22mm/px · 3 of 18 slices shown]
[im 3/18]
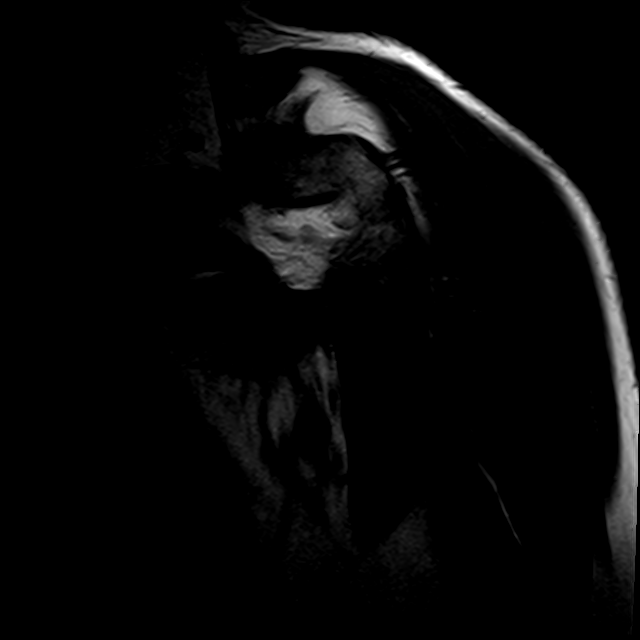
[im 9/18]
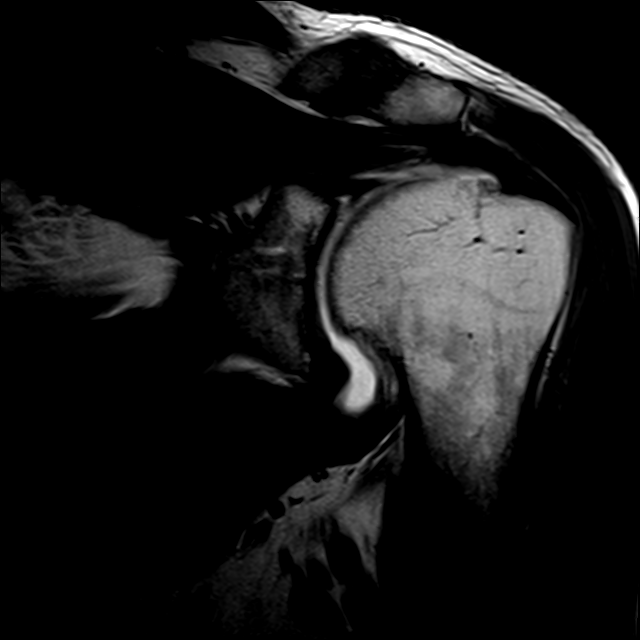
[im 15/18]
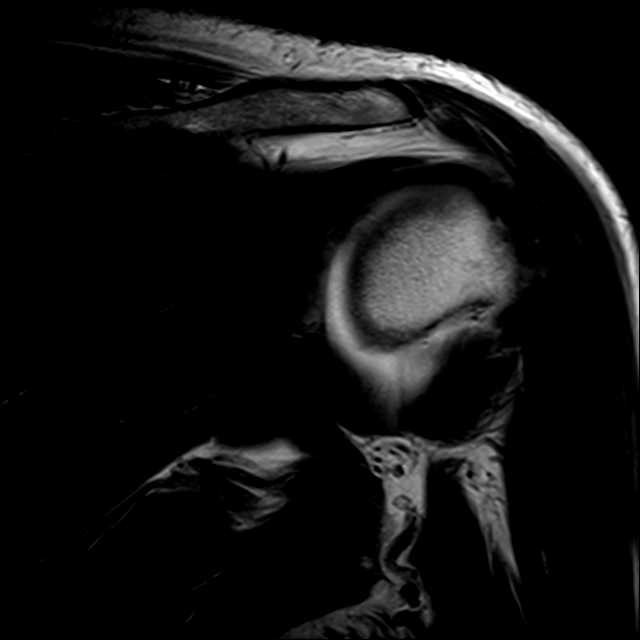

[Series 7: T2 fat-sat · oblique · 4.0mm · 0.55mm/px · 7 of 18 slices shown]
[im 1/18]
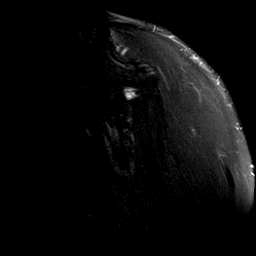
[im 3/18]
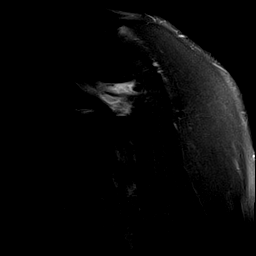
[im 6/18]
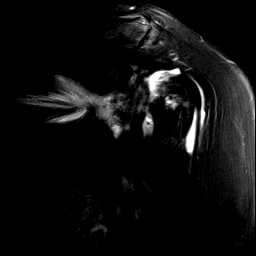
[im 9/18]
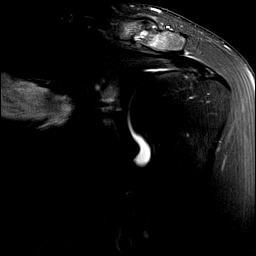
[im 12/18]
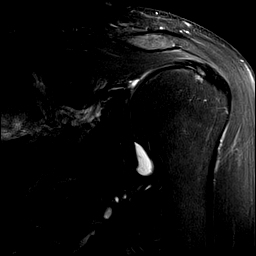
[im 15/18]
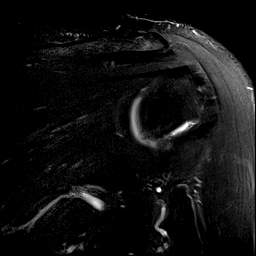
[im 18/18]
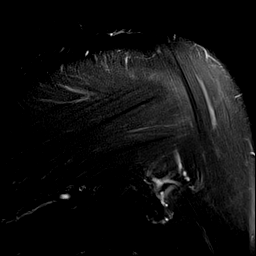

[18 of 40 positions shown; findings below may reference images not displayed]

FINDINGS: Rotator cuff: Mild supraspinatus, infraspinatus, and subscapularis
tendinopathy.

Muscles: Unremarkable. Incidental extravasation from the joint
between the subscapularis and the scapula.

Biceps long head: Unremarkable

Acromioclavicular Joint: Mild degenerative AC joint arthropathy.
Subacromial morphology is type 2 (curved). Physiologic fluid in the
subacromial subdeltoid bursa.

Glenohumeral Joint: Mild degenerative chondral thinning along the
upper glenoid.

Labrum: Mildly displaced SLAP tear extends from the posterior
superior labrum into the anterior superior labrum. There is also a
sublabral foramen in addition to the tear of the anterior superior
labrum. The SLAP tear extends to and somewhat beyond the biceps
anchor. However, the tear is not observed to propagate within the
biceps tendon itself. Images 7 through 12 of series 5 and images 6
through 8 of series 3 show the tear. There is also an anterior
glenoid anchor in place.

Bones: No significant extra-articular osseous abnormalities
identified.
IMPRESSION: 1. Prominent tear of the superior labrum extends from the posterior
superior labrum into the anterior superior labrum. It extends into
the biceps anchor but not into the biceps tendon itself. There is an
anterior glenoid anchor noted.
2. Mild rotator cuff tendinopathy without rotator cuff tear.
3. Mild degenerative AC joint arthropathy. Mild degenerative
chondral thinning along the upper glenoid.

## 2016-09-18 DIAGNOSIS — Z1389 Encounter for screening for other disorder: Secondary | ICD-10-CM | POA: Diagnosis not present

## 2016-09-18 DIAGNOSIS — I10 Essential (primary) hypertension: Secondary | ICD-10-CM | POA: Diagnosis not present

## 2016-09-18 DIAGNOSIS — R509 Fever, unspecified: Secondary | ICD-10-CM | POA: Diagnosis not present

## 2016-12-27 DIAGNOSIS — Z0000 Encounter for general adult medical examination without abnormal findings: Secondary | ICD-10-CM | POA: Diagnosis not present

## 2017-03-08 DIAGNOSIS — J069 Acute upper respiratory infection, unspecified: Secondary | ICD-10-CM | POA: Diagnosis not present

## 2017-05-23 DIAGNOSIS — H11002 Unspecified pterygium of left eye: Secondary | ICD-10-CM | POA: Diagnosis not present

## 2017-05-23 DIAGNOSIS — E119 Type 2 diabetes mellitus without complications: Secondary | ICD-10-CM | POA: Diagnosis not present

## 2017-06-03 DIAGNOSIS — J329 Chronic sinusitis, unspecified: Secondary | ICD-10-CM | POA: Diagnosis not present

## 2017-06-03 DIAGNOSIS — I10 Essential (primary) hypertension: Secondary | ICD-10-CM | POA: Diagnosis not present

## 2017-06-19 DIAGNOSIS — L821 Other seborrheic keratosis: Secondary | ICD-10-CM | POA: Diagnosis not present

## 2017-06-19 DIAGNOSIS — Z85820 Personal history of malignant melanoma of skin: Secondary | ICD-10-CM | POA: Diagnosis not present

## 2017-06-19 DIAGNOSIS — L570 Actinic keratosis: Secondary | ICD-10-CM | POA: Diagnosis not present

## 2017-06-19 DIAGNOSIS — D2262 Melanocytic nevi of left upper limb, including shoulder: Secondary | ICD-10-CM | POA: Diagnosis not present

## 2017-06-24 DIAGNOSIS — J029 Acute pharyngitis, unspecified: Secondary | ICD-10-CM | POA: Diagnosis not present

## 2017-06-26 DIAGNOSIS — I10 Essential (primary) hypertension: Secondary | ICD-10-CM | POA: Diagnosis not present

## 2017-07-03 DIAGNOSIS — Z7289 Other problems related to lifestyle: Secondary | ICD-10-CM | POA: Diagnosis not present

## 2017-07-03 DIAGNOSIS — H938X1 Other specified disorders of right ear: Secondary | ICD-10-CM | POA: Diagnosis not present

## 2017-07-03 DIAGNOSIS — J343 Hypertrophy of nasal turbinates: Secondary | ICD-10-CM | POA: Diagnosis not present

## 2017-07-24 DIAGNOSIS — Z23 Encounter for immunization: Secondary | ICD-10-CM | POA: Diagnosis not present

## 2017-11-11 DIAGNOSIS — M531 Cervicobrachial syndrome: Secondary | ICD-10-CM | POA: Diagnosis not present

## 2017-11-11 DIAGNOSIS — M50321 Other cervical disc degeneration at C4-C5 level: Secondary | ICD-10-CM | POA: Diagnosis not present

## 2017-11-11 DIAGNOSIS — M9901 Segmental and somatic dysfunction of cervical region: Secondary | ICD-10-CM | POA: Diagnosis not present

## 2017-11-12 DIAGNOSIS — M9901 Segmental and somatic dysfunction of cervical region: Secondary | ICD-10-CM | POA: Diagnosis not present

## 2017-11-12 DIAGNOSIS — M50321 Other cervical disc degeneration at C4-C5 level: Secondary | ICD-10-CM | POA: Diagnosis not present

## 2017-11-12 DIAGNOSIS — M531 Cervicobrachial syndrome: Secondary | ICD-10-CM | POA: Diagnosis not present

## 2017-11-13 ENCOUNTER — Ambulatory Visit (INDEPENDENT_AMBULATORY_CARE_PROVIDER_SITE_OTHER): Payer: 59

## 2017-11-13 ENCOUNTER — Encounter (INDEPENDENT_AMBULATORY_CARE_PROVIDER_SITE_OTHER): Payer: Self-pay | Admitting: Surgery

## 2017-11-13 ENCOUNTER — Ambulatory Visit (INDEPENDENT_AMBULATORY_CARE_PROVIDER_SITE_OTHER): Payer: 59 | Admitting: Surgery

## 2017-11-13 DIAGNOSIS — M7541 Impingement syndrome of right shoulder: Secondary | ICD-10-CM | POA: Diagnosis not present

## 2017-11-13 DIAGNOSIS — M50321 Other cervical disc degeneration at C4-C5 level: Secondary | ICD-10-CM | POA: Diagnosis not present

## 2017-11-13 DIAGNOSIS — M5412 Radiculopathy, cervical region: Secondary | ICD-10-CM | POA: Diagnosis not present

## 2017-11-13 DIAGNOSIS — M542 Cervicalgia: Secondary | ICD-10-CM

## 2017-11-13 DIAGNOSIS — M25511 Pain in right shoulder: Secondary | ICD-10-CM

## 2017-11-13 DIAGNOSIS — M9901 Segmental and somatic dysfunction of cervical region: Secondary | ICD-10-CM | POA: Diagnosis not present

## 2017-11-13 DIAGNOSIS — M531 Cervicobrachial syndrome: Secondary | ICD-10-CM | POA: Diagnosis not present

## 2017-11-13 MED ORDER — METHYLPREDNISOLONE 4 MG PO TABS: ORAL_TABLET | ORAL | 0 refills | Status: DC

## 2017-11-13 MED ORDER — METHOCARBAMOL 500 MG PO TABS: 500.0000 mg | ORAL_TABLET | Freq: Three times a day (TID) | ORAL | 0 refills | Status: DC | PRN

## 2017-11-13 MED ORDER — TRAMADOL HCL 50 MG PO TABS: 50.0000 mg | ORAL_TABLET | Freq: Three times a day (TID) | ORAL | 0 refills | Status: DC | PRN

## 2017-11-13 NOTE — Progress Notes (Signed)
Office Visit Note   Patient: Dylan Marshall           Date of Birth: April 19, 1961           MRN: [id number] Visit Date: 11/13/2017              Requested by: Angelina Sheriff, MD [place], [place] [address] PCP: Angelina Sheriff, MD   Assessment & Plan: Visit Diagnoses:  1. Neck pain   2. Acute pain of right shoulder   3. Radiculopathy, cervical region   4. Impingement syndrome of right shoulder     Plan: Since patient is currently describing issues with cervical radiculopathy and right shoulder impingement I will treat with Medrol 6-day taper taken as directed, Robaxin and Ultram.  Patient was also given a Depo-Medrol 80 mg IM injection today.  Will start prednisone taper tomorrow.  Follow-up in 2 weeks for recheck.  If radicular symptoms have resolved and shoulder continues to be a problem I may consider subacromial Marcaine/Depo-Medrol injection at that point.  If cervical symptoms persist I may get cervical MRI to rule out HNP/stenosis.  Follow-Up Instructions: Return in about 2 weeks (around 11/27/2017) for With Dr Lorin Mercy for recheck neck and shoulder.   Orders:  Orders Placed This Encounter  Procedures  . XR Cervical Spine 2 or 3 views  . XR Shoulder Right   No orders of the defined types were placed in this encounter.     Procedures: No procedures performed   Clinical Data: No additional findings.   Subjective: Chief Complaint  Patient presents with  . Right Shoulder - Pain, Numbness    HPI Patient comes in today with complaints of right-sided neck pain, right shoulder pain and right upper extremity radiculopathy.  Patient states that about 2-3 weeks ago he began having right scapular pain.  No injury.  The last few days this progressed to the right posterior and lateral shoulder.  Shoulder pain described as being constant at times but can be aggravated with overhead reaching.  Patient states that he had some intermittent pain also radiating  down to the right forearm.  He has had a couple of chiropractic treatments and therapeutic massage. Review of Systems No current cardiac pulmonary GI GU issues  Objective: Vital Signs: There were no vitals taken for this visit.  Physical Exam  Constitutional: He is oriented to person, place, and time. He appears well-developed. No distress.  HENT:  Head: Atraumatic.  Pulmonary/Chest: No respiratory distress.  Abdominal: He exhibits no distension.  Musculoskeletal:  Positive right brachial plexus tenderness.  Right shoulder has good range of motion.  Mildly positive impingement test.  Negative drop arm test.  Mild discomfort with supraspinatus resistance.  Right shoulder good range of motion.  Left shoulder unremarkable.  Negative Tinel's over the bilateral cubital and carpal tunnels.  Neurological: He is alert and oriented to person, place, and time.  Skin: Skin is warm and dry.  Psychiatric: He has a normal mood and affect.    Ortho Exam  Specialty Comments:  No specialty comments available.  Imaging: No results found.   PMFS History: Patient Active Problem List   Diagnosis Date Noted  . Labral tear of long head of biceps tendon 05/01/2015   Past Medical History:  Diagnosis Date  . Anxiety   . Arthritis    feet  . Depression   . GERD (gastroesophageal reflux disease)   . Hemorrhoid   . Hypertension   .  Kidney stone 1994  . PONV (postoperative nausea and vomiting)   . PVC's (premature ventricular contractions)    workup shows benign per patient  . Seasonal allergies     Family History  Problem Relation Age of Onset  . Colon cancer Neg Hx   . Esophageal cancer Neg Hx   . Rectal cancer Neg Hx   . Stomach cancer Neg Hx     Past Surgical History:  Procedure Laterality Date  . COLONOSCOPY  2003  . HERNIA REPAIR Left 1990  . SHOULDER ARTHROSCOPY WITH BICEPS TENDON REPAIR Left 05/01/2015   Procedure: SHOULDER ARTHROSCOPY debride labrum WITH BICEPS TENDON REPAIR;   Surgeon: Marybelle Killings, MD;  Location: [facility];  Service: Orthopedics;  Laterality: Left;  . SHOULDER SURGERY Left 1999   Social History   Occupational History  . Not on file  Tobacco Use  . Smoking status: Never Smoker  . Smokeless tobacco: Former Systems developer    Types: Chew  Substance and Sexual Activity  . Alcohol use: Yes    Alcohol/week: 2.4 - 3.0 oz    Types: 4 - 5 Cans of beer per week    Comment: social  . Drug use: No  . Sexual activity: Not on file

## 2017-11-14 DIAGNOSIS — M50321 Other cervical disc degeneration at C4-C5 level: Secondary | ICD-10-CM | POA: Diagnosis not present

## 2017-11-14 DIAGNOSIS — M531 Cervicobrachial syndrome: Secondary | ICD-10-CM | POA: Diagnosis not present

## 2017-11-14 DIAGNOSIS — M9901 Segmental and somatic dysfunction of cervical region: Secondary | ICD-10-CM | POA: Diagnosis not present

## 2017-11-17 DIAGNOSIS — M9901 Segmental and somatic dysfunction of cervical region: Secondary | ICD-10-CM | POA: Diagnosis not present

## 2017-11-17 DIAGNOSIS — M531 Cervicobrachial syndrome: Secondary | ICD-10-CM | POA: Diagnosis not present

## 2017-11-17 DIAGNOSIS — M50321 Other cervical disc degeneration at C4-C5 level: Secondary | ICD-10-CM | POA: Diagnosis not present

## 2017-11-19 DIAGNOSIS — M50321 Other cervical disc degeneration at C4-C5 level: Secondary | ICD-10-CM | POA: Diagnosis not present

## 2017-11-19 DIAGNOSIS — M9901 Segmental and somatic dysfunction of cervical region: Secondary | ICD-10-CM | POA: Diagnosis not present

## 2017-11-19 DIAGNOSIS — M531 Cervicobrachial syndrome: Secondary | ICD-10-CM | POA: Diagnosis not present

## 2017-11-21 DIAGNOSIS — M531 Cervicobrachial syndrome: Secondary | ICD-10-CM | POA: Diagnosis not present

## 2017-11-21 DIAGNOSIS — M50321 Other cervical disc degeneration at C4-C5 level: Secondary | ICD-10-CM | POA: Diagnosis not present

## 2017-11-21 DIAGNOSIS — M9901 Segmental and somatic dysfunction of cervical region: Secondary | ICD-10-CM | POA: Diagnosis not present

## 2017-11-24 DIAGNOSIS — M50321 Other cervical disc degeneration at C4-C5 level: Secondary | ICD-10-CM | POA: Diagnosis not present

## 2017-11-24 DIAGNOSIS — M9901 Segmental and somatic dysfunction of cervical region: Secondary | ICD-10-CM | POA: Diagnosis not present

## 2017-11-24 DIAGNOSIS — M531 Cervicobrachial syndrome: Secondary | ICD-10-CM | POA: Diagnosis not present

## 2017-11-26 ENCOUNTER — Encounter (INDEPENDENT_AMBULATORY_CARE_PROVIDER_SITE_OTHER): Payer: Self-pay | Admitting: Orthopaedic Surgery

## 2017-11-26 ENCOUNTER — Ambulatory Visit (INDEPENDENT_AMBULATORY_CARE_PROVIDER_SITE_OTHER): Payer: 59 | Admitting: Orthopaedic Surgery

## 2017-11-26 VITALS — BP 151/99 | HR 90

## 2017-11-26 DIAGNOSIS — M542 Cervicalgia: Secondary | ICD-10-CM | POA: Diagnosis not present

## 2017-11-26 DIAGNOSIS — M25511 Pain in right shoulder: Secondary | ICD-10-CM | POA: Diagnosis not present

## 2017-11-26 NOTE — Progress Notes (Signed)
Office Visit Note   Patient: Dylan Marshall           Date of Birth: 02-17-1961           MRN: [id number] Visit Date: 11/26/2017              Requested by: Angelina Sheriff, MD [place], [place] [address] PCP: Angelina Sheriff, MD   Assessment & Plan: Visit Diagnoses:  1. Right shoulder pain, unspecified chronicity   2. Neck pain     Plan: Patient has some mild cervical spondylosis.  He is more than 50% better after the prednisone.  He is doing neck stretching exercises and avoiding pulling with his right arm which may aggravate the biceps anchor.  I will check him back again in 4 weeks and if he is having persistent problems will consider diagnostic imaging.  I discussed with him some of this appears to be from his cervical spine but most what appears to be coming from his right biceps tendon anchor.  Follow-Up Instructions: Return in about 4 weeks (around 12/24/2017).   Orders:  No orders of the defined types were placed in this encounter.  No orders of the defined types were placed in this encounter.     Procedures: No procedures performed   Clinical Data: No additional findings.   Subjective: Chief Complaint  Patient presents with  . Neck - Pain, Follow-up  . Right Shoulder - Pain, Follow-up    HPI 57 year old male with right shoulder pain persists.  He states he is 50% better with the prednisone and tramadol.  He has had previous left shoulder labral repair several years ago in 2016.  Left shoulder is doing well.  He also has some pain in his neck with rotation.  He had some numbness that radiated to his long finger for period of couple weeks but states after the prednisone that that pain has improved.  Patient had biceps tenodesis in 2016 on the left shoulder.   Review of Systems unchanged from 11/13/2017 office visit.   Objective: Vital Signs: BP (!) 151/99   Pulse 90   Physical Exam  Constitutional: He is oriented to person, place,  and time. He appears well-developed and well-nourished.  HENT:  Head: Normocephalic and atraumatic.  Eyes: EOM are normal. Pupils are equal, round, and reactive to light.  Neck: No tracheal deviation present. No thyromegaly present.  Cardiovascular: Normal rate.  Pulmonary/Chest: Effort normal. He has no wheezes.  Abdominal: Soft. Bowel sounds are normal.  Neurological: He is alert and oriented to person, place, and time.  Skin: Skin is warm and dry. Capillary refill takes less than 2 seconds.  Psychiatric: He has a normal mood and affect. His behavior is normal. Judgment and thought content normal.    Ortho Exam patient has some brachial plexus tenderness.  Symmetrical right and left.  Mild positive Spurling on the right.  Negative drop arm test negative impingement.  Pain and tenderness anteriorly over the biceps tendon on the right negative Yergason test.  No subluxation of the shoulder.  Isolated supraspinatus testing is strong.  Sensation her hand is normal upper extremity reflexes are 2+.  Normal heel toe gait. Specialty Comments:  No specialty comments available.  Imaging: No results found.   PMFS History: Patient Active Problem List   Diagnosis Date Noted  . Right shoulder pain 11/26/2017  . Labral tear of long head of biceps tendon 05/01/2015   Past Medical History:  Diagnosis Date  . Anxiety   . Arthritis    feet  . Depression   . GERD (gastroesophageal reflux disease)   . Hemorrhoid   . Hypertension   . Kidney stone 1994  . PONV (postoperative nausea and vomiting)   . PVC's (premature ventricular contractions)    workup shows benign per patient  . Seasonal allergies     Family History  Problem Relation Age of Onset  . Colon cancer Neg Hx   . Esophageal cancer Neg Hx   . Rectal cancer Neg Hx   . Stomach cancer Neg Hx     Past Surgical History:  Procedure Laterality Date  . COLONOSCOPY  2003  . HERNIA REPAIR Left 1990  . SHOULDER ARTHROSCOPY WITH BICEPS  TENDON REPAIR Left 05/01/2015   Procedure: SHOULDER ARTHROSCOPY debride labrum WITH BICEPS TENDON REPAIR;  Surgeon: Marybelle Killings, MD;  Location: [facility];  Service: Orthopedics;  Laterality: Left;  . SHOULDER SURGERY Left 1999   Social History   Occupational History  . Not on file  Tobacco Use  . Smoking status: Never Smoker  . Smokeless tobacco: Former Systems developer    Types: Chew  Substance and Sexual Activity  . Alcohol use: Yes    Alcohol/week: 2.4 - 3.0 oz    Types: 4 - 5 Cans of beer per week    Comment: social  . Drug use: No  . Sexual activity: Not on file

## 2017-12-01 DIAGNOSIS — M50321 Other cervical disc degeneration at C4-C5 level: Secondary | ICD-10-CM | POA: Diagnosis not present

## 2017-12-01 DIAGNOSIS — M531 Cervicobrachial syndrome: Secondary | ICD-10-CM | POA: Diagnosis not present

## 2017-12-01 DIAGNOSIS — M9901 Segmental and somatic dysfunction of cervical region: Secondary | ICD-10-CM | POA: Diagnosis not present

## 2017-12-03 DIAGNOSIS — M50321 Other cervical disc degeneration at C4-C5 level: Secondary | ICD-10-CM | POA: Diagnosis not present

## 2017-12-03 DIAGNOSIS — M531 Cervicobrachial syndrome: Secondary | ICD-10-CM | POA: Diagnosis not present

## 2017-12-03 DIAGNOSIS — M9901 Segmental and somatic dysfunction of cervical region: Secondary | ICD-10-CM | POA: Diagnosis not present

## 2017-12-31 DIAGNOSIS — Z0000 Encounter for general adult medical examination without abnormal findings: Secondary | ICD-10-CM | POA: Diagnosis not present

## 2018-04-15 DIAGNOSIS — M6530 Trigger finger, unspecified finger: Secondary | ICD-10-CM | POA: Diagnosis not present

## 2018-05-21 DIAGNOSIS — H11002 Unspecified pterygium of left eye: Secondary | ICD-10-CM | POA: Diagnosis not present

## 2018-05-21 DIAGNOSIS — E119 Type 2 diabetes mellitus without complications: Secondary | ICD-10-CM | POA: Diagnosis not present

## 2018-06-23 DIAGNOSIS — Z23 Encounter for immunization: Secondary | ICD-10-CM | POA: Diagnosis not present

## 2018-06-25 DIAGNOSIS — Z85820 Personal history of malignant melanoma of skin: Secondary | ICD-10-CM | POA: Diagnosis not present

## 2018-06-25 DIAGNOSIS — L814 Other melanin hyperpigmentation: Secondary | ICD-10-CM | POA: Diagnosis not present

## 2018-06-25 DIAGNOSIS — L738 Other specified follicular disorders: Secondary | ICD-10-CM | POA: Diagnosis not present

## 2018-06-30 DIAGNOSIS — Z131 Encounter for screening for diabetes mellitus: Secondary | ICD-10-CM | POA: Diagnosis not present

## 2018-06-30 DIAGNOSIS — Z0000 Encounter for general adult medical examination without abnormal findings: Secondary | ICD-10-CM | POA: Diagnosis not present

## 2018-07-30 DIAGNOSIS — R809 Proteinuria, unspecified: Secondary | ICD-10-CM | POA: Diagnosis not present

## 2018-07-30 DIAGNOSIS — E1129 Type 2 diabetes mellitus with other diabetic kidney complication: Secondary | ICD-10-CM | POA: Diagnosis not present

## 2018-07-30 DIAGNOSIS — Z6827 Body mass index (BMI) 27.0-27.9, adult: Secondary | ICD-10-CM | POA: Diagnosis not present

## 2018-09-22 DIAGNOSIS — M6530 Trigger finger, unspecified finger: Secondary | ICD-10-CM | POA: Diagnosis not present

## 2019-01-04 DIAGNOSIS — Z6827 Body mass index (BMI) 27.0-27.9, adult: Secondary | ICD-10-CM | POA: Diagnosis not present

## 2019-01-04 DIAGNOSIS — Z0000 Encounter for general adult medical examination without abnormal findings: Secondary | ICD-10-CM | POA: Diagnosis not present

## 2020-02-28 ENCOUNTER — Ambulatory Visit: Payer: 59 | Admitting: Orthopaedic Surgery

## 2020-02-28 ENCOUNTER — Encounter: Payer: Self-pay | Admitting: Orthopaedic Surgery

## 2020-02-28 ENCOUNTER — Ambulatory Visit: Payer: Self-pay

## 2020-02-28 VITALS — BP 160/96 | HR 72 | Ht 69.0 in | Wt 180.0 lb

## 2020-02-28 DIAGNOSIS — M549 Dorsalgia, unspecified: Secondary | ICD-10-CM

## 2020-02-28 DIAGNOSIS — M542 Cervicalgia: Secondary | ICD-10-CM | POA: Diagnosis not present

## 2020-02-28 NOTE — Progress Notes (Signed)
Office Visit Note   Patient: GUY SEESE           Date of Birth: 26-May-1961           MRN: [id number] Visit Date: 02/28/2020              Requested by: Angelina Sheriff, MD [place],  [place] [address] PCP: Angelina Sheriff, MD   Assessment & Plan: Visit Diagnoses:  1. Mid back pain   2. Neck pain     Plan: Patient got good relief with his symptoms with distraction.  We will set him up for some home cervical traction that he can use intermittently.  Continue anti-inflammatories.  With his diabetes I recommend avoiding Medrol or prednisone Dosepak.  If he does not get continued improvement he can return or call and we can consider cervical spine MRI scan.  Follow-Up Instructions: No follow-ups on file.   Orders:  Orders Placed This Encounter  Procedures  . XR Cervical Spine 2 or 3 views  . XR Thoracic Spine 2 View   No orders of the defined types were placed in this encounter.     Procedures: No procedures performed   Clinical Data: No additional findings.   Subjective: Chief Complaint  Patient presents with  . 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  He been seen in 2019 with similar problems which resolved.  Patient does have diabetes type 2 on Metformin.  He was up on a ladder doing some work overhead looking up [employer] reaching and had gradual increase in symptoms now present for 2 to 3 weeks.  Patient is here with his wife who provide additional history.  Review of Systems positive for diabetes hypertension previous cervical disc protrusion   Objective: Vital Signs: BP (!) 160/96   Pulse 72   Ht 5\' 9"  (1.753 m)   Wt 180 lb (81.6 kg)   BMI 26.58 kg/m   Physical Exam Constitutional:      Appearance: He is well-developed.  HENT:     Head: Normocephalic and atraumatic.  Eyes:     Pupils: Pupils are equal, round, and reactive  to light.  Neck:     Thyroid: No thyromegaly.     Trachea: No tracheal deviation.  Cardiovascular:     Rate and Rhythm: Normal rate.  Pulmonary:     Effort: Pulmonary effort is normal.     Breath sounds: No wheezing.  Abdominal:     General: Bowel sounds are normal.     Palpations: Abdomen is soft.  Skin:    General: Skin is warm and dry.     Capillary Refill: Capillary refill takes less than 2 seconds.  Neurological:     Mental Status: He is alert and oriented to person, place, and time.  Psychiatric:        Behavior: Behavior normal.        Thought Content: Thought content normal.        Judgment: Judgment normal.     Ortho Exam patient has increased pain with cervical compression and some relief with distraction.  Some brachial plexus tenderness tenderness of the superior medial border of the scapula right and left.  Upper extremity reflexes are 2+ no isolated motor weakness.  Negative impingement the shoulders.  Neck pain reproduced with outstretched abduction of the shoulder against resistance.  Normal  heel toe gait no lower extremity hyperreflexia.  Specialty Comments:  No specialty comments available.  Imaging: No results found.   PMFS History: Patient Active Problem List   Diagnosis Date Noted  . Right shoulder pain 11/26/2017  . Labral tear of long head of biceps tendon 05/01/2015   Past Medical History:  Diagnosis Date  . Anxiety   . Arthritis    feet  . Depression   . GERD (gastroesophageal reflux disease)   . Hemorrhoid   . Hypertension   . Kidney stone 1994  . PONV (postoperative nausea and vomiting)   . PVC's (premature ventricular contractions)    workup shows benign per patient  . Seasonal allergies     Family History  Problem Relation Age of Onset  . Colon cancer Neg Hx   . Esophageal cancer Neg Hx   . Rectal cancer Neg Hx   . Stomach cancer Neg Hx     Past Surgical History:  Procedure Laterality Date  . COLONOSCOPY  2003  . HERNIA  REPAIR Left 1990  . SHOULDER ARTHROSCOPY WITH BICEPS TENDON REPAIR Left 05/01/2015   Procedure: SHOULDER ARTHROSCOPY debride labrum WITH BICEPS TENDON REPAIR;  Surgeon: Marybelle Killings, MD;  Location: [facility];  Service: Orthopedics;  Laterality: Left;  . SHOULDER SURGERY Left 1999   Social History   Occupational History  . Not on file  Tobacco Use  . Smoking status: Never Smoker  . Smokeless tobacco: Former Systems developer    Types: Chew  Substance and Sexual Activity  . Alcohol use: Yes    Alcohol/week: 4.0 - 5.0 standard drinks    Types: 4 - 5 Cans of beer per week    Comment: social  . Drug use: No  . Sexual activity: Not on file

## 2020-03-28 ENCOUNTER — Ambulatory Visit: Payer: 59 | Admitting: Orthopaedic Surgery

## 2020-09-12 DIAGNOSIS — M5020 Other cervical disc displacement, unspecified cervical region: Secondary | ICD-10-CM | POA: Diagnosis not present

## 2020-09-12 DIAGNOSIS — Z6827 Body mass index (BMI) 27.0-27.9, adult: Secondary | ICD-10-CM | POA: Diagnosis not present

## 2020-09-12 DIAGNOSIS — I10 Essential (primary) hypertension: Secondary | ICD-10-CM | POA: Diagnosis not present

## 2020-10-12 DIAGNOSIS — M5013 Cervical disc disorder with radiculopathy, cervicothoracic region: Secondary | ICD-10-CM | POA: Diagnosis not present

## 2020-10-12 DIAGNOSIS — M4722 Other spondylosis with radiculopathy, cervical region: Secondary | ICD-10-CM | POA: Diagnosis not present

## 2020-10-31 DIAGNOSIS — M4722 Other spondylosis with radiculopathy, cervical region: Secondary | ICD-10-CM | POA: Diagnosis not present

## 2020-12-29 DIAGNOSIS — M5020 Other cervical disc displacement, unspecified cervical region: Secondary | ICD-10-CM | POA: Diagnosis not present

## 2021-01-04 DIAGNOSIS — Z20828 Contact with and (suspected) exposure to other viral communicable diseases: Secondary | ICD-10-CM | POA: Diagnosis not present

## 2021-01-04 DIAGNOSIS — R509 Fever, unspecified: Secondary | ICD-10-CM | POA: Diagnosis not present

## 2021-01-04 DIAGNOSIS — J329 Chronic sinusitis, unspecified: Secondary | ICD-10-CM | POA: Diagnosis not present

## 2021-01-04 DIAGNOSIS — J40 Bronchitis, not specified as acute or chronic: Secondary | ICD-10-CM | POA: Diagnosis not present

## 2021-01-30 DIAGNOSIS — Z0000 Encounter for general adult medical examination without abnormal findings: Secondary | ICD-10-CM | POA: Diagnosis not present

## 2021-01-30 DIAGNOSIS — Z6826 Body mass index (BMI) 26.0-26.9, adult: Secondary | ICD-10-CM | POA: Diagnosis not present

## 2021-05-07 DIAGNOSIS — J40 Bronchitis, not specified as acute or chronic: Secondary | ICD-10-CM | POA: Diagnosis not present

## 2021-05-07 DIAGNOSIS — Z6826 Body mass index (BMI) 26.0-26.9, adult: Secondary | ICD-10-CM | POA: Diagnosis not present

## 2021-05-07 DIAGNOSIS — J329 Chronic sinusitis, unspecified: Secondary | ICD-10-CM | POA: Diagnosis not present

## 2021-06-12 DIAGNOSIS — E119 Type 2 diabetes mellitus without complications: Secondary | ICD-10-CM | POA: Diagnosis not present

## 2021-06-12 DIAGNOSIS — Z23 Encounter for immunization: Secondary | ICD-10-CM | POA: Diagnosis not present

## 2021-07-19 DIAGNOSIS — L814 Other melanin hyperpigmentation: Secondary | ICD-10-CM | POA: Diagnosis not present

## 2021-07-19 DIAGNOSIS — D2361 Other benign neoplasm of skin of right upper limb, including shoulder: Secondary | ICD-10-CM | POA: Diagnosis not present

## 2021-07-19 DIAGNOSIS — L821 Other seborrheic keratosis: Secondary | ICD-10-CM | POA: Diagnosis not present

## 2021-07-19 DIAGNOSIS — D2261 Melanocytic nevi of right upper limb, including shoulder: Secondary | ICD-10-CM | POA: Diagnosis not present

## 2021-07-19 DIAGNOSIS — B078 Other viral warts: Secondary | ICD-10-CM | POA: Diagnosis not present

## 2021-07-31 DIAGNOSIS — H2513 Age-related nuclear cataract, bilateral: Secondary | ICD-10-CM | POA: Diagnosis not present

## 2021-07-31 DIAGNOSIS — E119 Type 2 diabetes mellitus without complications: Secondary | ICD-10-CM | POA: Diagnosis not present

## 2021-07-31 DIAGNOSIS — H11002 Unspecified pterygium of left eye: Secondary | ICD-10-CM | POA: Diagnosis not present

## 2022-02-19 DIAGNOSIS — Z0000 Encounter for general adult medical examination without abnormal findings: Secondary | ICD-10-CM | POA: Diagnosis not present

## 2022-02-19 DIAGNOSIS — Z131 Encounter for screening for diabetes mellitus: Secondary | ICD-10-CM | POA: Diagnosis not present

## 2022-02-19 DIAGNOSIS — Z1331 Encounter for screening for depression: Secondary | ICD-10-CM | POA: Diagnosis not present

## 2022-02-19 DIAGNOSIS — Z125 Encounter for screening for malignant neoplasm of prostate: Secondary | ICD-10-CM | POA: Diagnosis not present

## 2022-02-19 DIAGNOSIS — Z6826 Body mass index (BMI) 26.0-26.9, adult: Secondary | ICD-10-CM | POA: Diagnosis not present

## 2022-06-13 DIAGNOSIS — H35372 Puckering of macula, left eye: Secondary | ICD-10-CM | POA: Diagnosis not present

## 2022-06-28 DIAGNOSIS — Z23 Encounter for immunization: Secondary | ICD-10-CM | POA: Diagnosis not present

## 2022-07-30 DIAGNOSIS — L814 Other melanin hyperpigmentation: Secondary | ICD-10-CM | POA: Diagnosis not present

## 2022-07-30 DIAGNOSIS — L821 Other seborrheic keratosis: Secondary | ICD-10-CM | POA: Diagnosis not present

## 2022-07-30 DIAGNOSIS — D1801 Hemangioma of skin and subcutaneous tissue: Secondary | ICD-10-CM | POA: Diagnosis not present

## 2022-07-30 DIAGNOSIS — D224 Melanocytic nevi of scalp and neck: Secondary | ICD-10-CM | POA: Diagnosis not present

## 2022-07-30 DIAGNOSIS — D2371 Other benign neoplasm of skin of right lower limb, including hip: Secondary | ICD-10-CM | POA: Diagnosis not present

## 2022-08-06 DIAGNOSIS — H2513 Age-related nuclear cataract, bilateral: Secondary | ICD-10-CM | POA: Diagnosis not present

## 2022-08-06 DIAGNOSIS — E119 Type 2 diabetes mellitus without complications: Secondary | ICD-10-CM | POA: Diagnosis not present

## 2022-08-06 DIAGNOSIS — H524 Presbyopia: Secondary | ICD-10-CM | POA: Diagnosis not present

## 2022-08-06 DIAGNOSIS — H43822 Vitreomacular adhesion, left eye: Secondary | ICD-10-CM | POA: Diagnosis not present

## 2023-03-07 DIAGNOSIS — E1129 Type 2 diabetes mellitus with other diabetic kidney complication: Secondary | ICD-10-CM | POA: Diagnosis not present

## 2023-03-07 DIAGNOSIS — R809 Proteinuria, unspecified: Secondary | ICD-10-CM | POA: Diagnosis not present

## 2023-03-07 DIAGNOSIS — E782 Mixed hyperlipidemia: Secondary | ICD-10-CM | POA: Diagnosis not present

## 2023-03-07 DIAGNOSIS — I10 Essential (primary) hypertension: Secondary | ICD-10-CM | POA: Diagnosis not present

## 2023-03-25 DIAGNOSIS — Z131 Encounter for screening for diabetes mellitus: Secondary | ICD-10-CM | POA: Diagnosis not present

## 2023-03-25 DIAGNOSIS — Z125 Encounter for screening for malignant neoplasm of prostate: Secondary | ICD-10-CM | POA: Diagnosis not present

## 2023-03-25 DIAGNOSIS — Z13220 Encounter for screening for lipoid disorders: Secondary | ICD-10-CM | POA: Diagnosis not present

## 2023-03-25 DIAGNOSIS — Z0000 Encounter for general adult medical examination without abnormal findings: Secondary | ICD-10-CM | POA: Diagnosis not present

## 2023-04-03 ENCOUNTER — Encounter: Payer: Self-pay | Admitting: Gastroenterology

## 2023-04-29 DIAGNOSIS — M25551 Pain in right hip: Secondary | ICD-10-CM | POA: Diagnosis not present

## 2023-04-29 DIAGNOSIS — M5431 Sciatica, right side: Secondary | ICD-10-CM | POA: Diagnosis not present

## 2023-05-14 DIAGNOSIS — M545 Low back pain, unspecified: Secondary | ICD-10-CM | POA: Diagnosis not present

## 2023-05-15 ENCOUNTER — Ambulatory Visit (AMBULATORY_SURGERY_CENTER): Payer: Self-pay

## 2023-05-15 VITALS — Ht 69.0 in | Wt 173.0 lb

## 2023-05-15 DIAGNOSIS — R292 Abnormal reflex: Secondary | ICD-10-CM | POA: Insufficient documentation

## 2023-05-15 DIAGNOSIS — M5020 Other cervical disc displacement, unspecified cervical region: Secondary | ICD-10-CM | POA: Insufficient documentation

## 2023-05-15 DIAGNOSIS — Z1211 Encounter for screening for malignant neoplasm of colon: Secondary | ICD-10-CM

## 2023-05-15 DIAGNOSIS — M5412 Radiculopathy, cervical region: Secondary | ICD-10-CM | POA: Insufficient documentation

## 2023-05-15 DIAGNOSIS — R258 Other abnormal involuntary movements: Secondary | ICD-10-CM | POA: Insufficient documentation

## 2023-05-15 MED ORDER — NA SULFATE-K SULFATE-MG SULF 17.5-3.13-1.6 GM/177ML PO SOLN: 1.0000 | Freq: Once | ORAL | 0 refills | Status: AC

## 2023-05-15 NOTE — Progress Notes (Signed)
 No egg or soy allergy known to patient  No issues known to pt with past sedation with any surgeries or procedures Patient denies ever being told they had issues or difficulty with intubation  No FH of Malignant Hyperthermia Pt is not on diet pills Pt is not on  home 02  Pt is not on blood thinners  Pt denies issues with constipation  No A fib or A flutter Have any cardiac testing pending--no  LOA: independent  Prep: suprep   Patient's chart reviewed by Cathlyn Parsons CNRA prior to previsit and patient appropriate for the [facility].  Previsit completed and red dot placed by patient's name on their procedure day (on provider's schedule).     PV competed with patient. Prep instructions sent via mychart

## 2023-05-20 DIAGNOSIS — M545 Low back pain, unspecified: Secondary | ICD-10-CM | POA: Diagnosis not present

## 2023-05-21 DIAGNOSIS — M545 Low back pain, unspecified: Secondary | ICD-10-CM | POA: Diagnosis not present

## 2023-05-22 ENCOUNTER — Encounter: Payer: Self-pay | Admitting: Gastroenterology

## 2023-05-27 DIAGNOSIS — M47816 Spondylosis without myelopathy or radiculopathy, lumbar region: Secondary | ICD-10-CM | POA: Diagnosis not present

## 2023-05-27 DIAGNOSIS — M5416 Radiculopathy, lumbar region: Secondary | ICD-10-CM | POA: Diagnosis not present

## 2023-05-27 DIAGNOSIS — M545 Low back pain, unspecified: Secondary | ICD-10-CM | POA: Diagnosis not present

## 2023-05-29 DIAGNOSIS — M545 Low back pain, unspecified: Secondary | ICD-10-CM | POA: Diagnosis not present

## 2023-06-02 DIAGNOSIS — M545 Low back pain, unspecified: Secondary | ICD-10-CM | POA: Diagnosis not present

## 2023-06-03 ENCOUNTER — Encounter: Payer: Self-pay | Admitting: Gastroenterology

## 2023-06-03 ENCOUNTER — Ambulatory Visit: Payer: BC Managed Care – PPO | Admitting: Gastroenterology

## 2023-06-03 VITALS — BP 108/77 | HR 85 | Temp 97.3°F | Resp 16 | Ht 69.0 in | Wt 173.0 lb

## 2023-06-03 DIAGNOSIS — D126 Benign neoplasm of colon, unspecified: Secondary | ICD-10-CM | POA: Diagnosis not present

## 2023-06-03 DIAGNOSIS — Z1211 Encounter for screening for malignant neoplasm of colon: Secondary | ICD-10-CM

## 2023-06-03 DIAGNOSIS — K621 Rectal polyp: Secondary | ICD-10-CM | POA: Diagnosis not present

## 2023-06-03 DIAGNOSIS — D128 Benign neoplasm of rectum: Secondary | ICD-10-CM

## 2023-06-03 MED ORDER — SODIUM CHLORIDE 0.9 % IV SOLN: 500.0000 mL | INTRAVENOUS | Status: DC

## 2023-06-03 NOTE — Op Note (Signed)
[facility] Patient Name: Dylan Marshall Procedure Date: 06/03/2023 7:56 AM MRN: [id number] Endoscopist: Viviann Spare P. Adela Lank , MD, [id number] Age: 62 Referring MD:  Date of Birth: 10/29/1960 Gender: Male Account #: [id number] Procedure:                Colonoscopy Indications:              Screening for colorectal malignant neoplasm Medicines:                Monitored Anesthesia Care Procedure:                Pre-Anesthesia Assessment:                           - Prior to the procedure, a History and Physical                            was performed, and patient medications and                            allergies were reviewed. The patient's tolerance of                            previous anesthesia was also reviewed. The risks                            and benefits of the procedure and the sedation                            options and risks were discussed with the patient.                            All questions were answered, and informed consent                            was obtained. Prior Anticoagulants: The patient has                            taken no anticoagulant or antiplatelet agents. ASA                            Grade Assessment: II - A patient with mild systemic                            disease. After reviewing the risks and benefits,                            the patient was deemed in satisfactory condition to                            undergo the procedure.                           After obtaining informed consent, the colonoscope  was passed under direct vision. Throughout the                            procedure, the patient's blood pressure, pulse, and                            oxygen saturations were monitored continuously. The                            Olympus Scope [id number] was introduced through the                            anus and advanced to the the cecum, identified by                             appendiceal orifice and ileocecal valve. The                            colonoscopy was performed without difficulty. The                            patient tolerated the procedure well. The quality                            of the bowel preparation was good. The ileocecal                            valve, appendiceal orifice, and rectum were                            photographed. Scope In: 8:01:18 AM Scope Out: 8:18:50 AM Scope Withdrawal Time: 0 hours 14 minutes 5 seconds  Total Procedure Duration: 0 hours 17 minutes 32 seconds  Findings:                 The perianal and digital rectal examinations were                            normal.                           A 3 mm polyp was found in the rectum. The polyp was                            sessile. The polyp was removed with a cold snare.                            Resection and retrieval were complete.                           A few small-mouthed diverticula were found in the                            sigmoid colon.  Internal hemorrhoids were found during retroflexion.                           The exam was otherwise without abnormality. Complications:            No immediate complications. Estimated blood loss:                            Minimal. Estimated Blood Loss:     Estimated blood loss was minimal. Impression:               - One 3 mm polyp in the rectum, removed with a cold                            snare. Resected and retrieved.                           - Diverticulosis in the sigmoid colon.                           - Internal hemorrhoids.                           - The examination was otherwise normal. Recommendation:           - Patient has a contact number available for                            emergencies. The signs and symptoms of potential                            delayed complications were discussed with the                            patient. Return to normal activities  tomorrow.                            Written discharge instructions were provided to the                            patient.                           - Resume previous diet.                           - Continue present medications.                           - Await pathology results. Viviann Spare P. Adela Lank, MD 06/03/2023 8:22:48 AM This report has been signed electronically.

## 2023-06-03 NOTE — Patient Instructions (Addendum)
Resume previous diet Continue present medications Await pathology results  Handouts/information given for polyps.  YOU HAD AN ENDOSCOPIC PROCEDURE TODAY AT THE [facility]:   Refer to the procedure report that was given to you for any specific questions about what was found during the examination.  If the procedure report does not answer your questions, please call your gastroenterologist to clarify.  If you requested that your care partner not be given the details of your procedure findings, then the procedure report has been included in a sealed envelope for you to review at your convenience later.  YOU SHOULD EXPECT: Some feelings of bloating in the abdomen. Passage of more gas than usual.  Walking can help get rid of the air that was put into your GI tract during the procedure and reduce the bloating. If you had a lower endoscopy (such as a colonoscopy or flexible sigmoidoscopy) you may notice spotting of blood in your stool or on the toilet paper. If you underwent a bowel prep for your procedure, you may not have a normal bowel movement for a few days.  Please Note:  You might notice some irritation and congestion in your nose or some drainage.  This is from the oxygen used during your procedure.  There is no need for concern and it should clear up in a day or so.  SYMPTOMS TO REPORT IMMEDIATELY:  Following lower endoscopy (colonoscopy or flexible sigmoidoscopy):  Excessive amounts of blood in the stool  Significant tenderness or worsening of abdominal pains  Swelling of the abdomen that is new, acute  Fever of 100F or higher  For urgent or emergent issues, a gastroenterologist can be reached at any hour by calling [phone]. Do not use MyChart messaging for urgent concerns.    DIET:  We do recommend a small meal at first, but then you may proceed to your regular diet.  Drink plenty of fluids but you should avoid alcoholic beverages for 24 hours.  ACTIVITY:  You  should plan to take it easy for the rest of today and you should NOT DRIVE or use heavy machinery until tomorrow (because of the sedation medicines used during the test).    FOLLOW UP: Our staff will call the number listed on your records the next business day following your procedure.  We will call around 7:15- 8:00 am to check on you and address any questions or concerns that you may have regarding the information given to you following your procedure. If we do not reach you, we will leave a message.     If any biopsies were taken you will be contacted by phone or by letter within the next 1-3 weeks.  Please call us at [phone] if you have not heard about the biopsies in 3 weeks.    SIGNATURES/CONFIDENTIALITY: You and/or your care partner have signed paperwork which will be entered into your electronic medical record.  These signatures attest to the fact that that the information above on your After Visit Summary has been reviewed and is understood.  Full responsibility of the confidentiality of this discharge information lies with you and/or your care-partner.

## 2023-06-03 NOTE — Progress Notes (Signed)
Called to room to assist during endoscopic procedure.  Patient ID and intended procedure confirmed with present staff. Received instructions for my participation in the procedure from the performing physician.  

## 2023-06-03 NOTE — Progress Notes (Signed)
[facility] History and Physical   Primary Care Physician:  Noni Saupe, MD   Reason for Procedure:   Colon cancer screening  Plan:    colonoscopy     HPI: Dylan Marshall is a 62 y.o. male  here for colonoscopy screening  - last exam 2014.  Patient denies any bowel symptoms at this time. No family history of colon cancer known. Otherwise feels well without any cardiopulmonary symptoms.   I have discussed risks / benefits of anesthesia and endoscopic procedure with Thereasa Solo and they wish to proceed with the exams as outlined today.    Past Medical History:  Diagnosis Date   Anxiety    Arthritis    feet   Depression    GERD (gastroesophageal reflux disease)    Hemorrhoid    Hypertension    Kidney stone 1994   PONV (postoperative nausea and vomiting)    PVC's (premature ventricular contractions)    workup shows benign per patient   Seasonal allergies     Past Surgical History:  Procedure Laterality Date   COLONOSCOPY  2003   HERNIA REPAIR Left 1990   SHOULDER ARTHROSCOPY WITH BICEPS TENDON REPAIR Left 05/01/2015   Procedure: SHOULDER ARTHROSCOPY debride labrum WITH BICEPS TENDON REPAIR;  Surgeon: Eldred Manges, MD;  Location: [facility];  Service: Orthopedics;  Laterality: Left;   SHOULDER SURGERY Left 1999    Prior to Admission medications   Medication Sig Start Date End Date Taking? Authorizing Provider  amLODipine (NORVASC) 5 MG tablet Take 5 mg by mouth daily.   Yes [provider]  cetirizine (ZYRTEC) 10 MG chewable tablet Chew 10 mg by mouth daily.   Yes [provider]  dicyclomine (BENTYL) 10 MG capsule Take 10 mg by mouth 4 (four) times daily as needed.   Yes [provider]  JARDIANCE 25 MG TABS tablet Take 25 mg by mouth daily. 03/07/23  Yes [provider]  lisinopril (PRINIVIL,ZESTRIL) 20 MG tablet Take 20 mg by mouth daily.   Yes [provider]  pantoprazole  (PROTONIX) 40 MG tablet Take 40 mg by mouth daily.   Yes [provider]  pravastatin (PRAVACHOL) 10 MG tablet Take 10 mg by mouth at bedtime.   Yes [provider]  sertraline (ZOLOFT) 50 MG tablet Take 50 mg by mouth daily.   Yes [provider]  Probiotic Product (PROBIOTIC DAILY PO) Take 1 tablet by mouth daily. Patient not taking: Reported on 06/03/2023    [provider]    Current Outpatient Medications  Medication Sig Dispense Refill   amLODipine (NORVASC) 5 MG tablet Take 5 mg by mouth daily.     cetirizine (ZYRTEC) 10 MG chewable tablet Chew 10 mg by mouth daily.     dicyclomine (BENTYL) 10 MG capsule Take 10 mg by mouth 4 (four) times daily as needed.     JARDIANCE 25 MG TABS tablet Take 25 mg by mouth daily.     lisinopril (PRINIVIL,ZESTRIL) 20 MG tablet Take 20 mg by mouth daily.     pantoprazole (PROTONIX) 40 MG tablet Take 40 mg by mouth daily.     pravastatin (PRAVACHOL) 10 MG tablet Take 10 mg by mouth at bedtime.     sertraline (ZOLOFT) 50 MG tablet Take 50 mg by mouth daily.     Probiotic Product (PROBIOTIC DAILY PO) Take 1 tablet by mouth daily. (Patient not taking: Reported on 06/03/2023)     Current Facility-Administered  Medications  Medication Dose Route Frequency Provider Last Rate Last Admin   0.9 %  sodium chloride infusion  500 mL Intravenous Continuous Kylyn Mcdade, Willaim Rayas, MD        Allergies as of 06/03/2023 - Review Complete 06/03/2023  Allergen Reaction Noted   Codeine Nausea Only 03/04/2013   Erythromycin Nausea Only 03/04/2013   Sulfa antibiotics Nausea Only 03/04/2013   Adhesive [tape] Rash 04/28/2015   Hydrocodone-acetaminophen Rash 05/01/2015   Oxycodone-acetaminophen Rash 05/01/2015    Family History  Problem Relation Age of Onset   Colon cancer Neg Hx    Esophageal cancer Neg Hx    Rectal cancer Neg Hx    Stomach cancer Neg Hx     Social History   Socioeconomic History   Marital status: Married     Spouse name: Not on file   Number of children: Not on file   Years of education: Not on file   Highest education level: Not on file  Occupational History   Not on file  Tobacco Use   Smoking status: Never   Smokeless tobacco: Former    Types: Chew    Quit date: 09/09/1982  Vaping Use   Vaping status: Never Used  Substance and Sexual Activity   Alcohol use: Yes    Alcohol/week: 4.0 - 5.0 standard drinks of alcohol    Types: 4 - 5 Cans of beer per week    Comment: social   Drug use: No   Sexual activity: Not on file  Other Topics Concern   Not on file  Social History Narrative   Not on file   Social Determinants of Health   Financial Resource Strain: Not on file  Food Insecurity: Not on file  Transportation Needs: Not on file  Physical Activity: Not on file  Stress: Not on file  Social Connections: Not on file  Intimate Partner Violence: Not on file    Review of Systems: All other review of systems negative except as mentioned in the HPI.  Physical Exam: Vital signs BP 132/76   Pulse 85   Temp (!) 97.3 F (36.3 C) (Temporal)   Ht 5\' 9"  (1.753 m)   Wt 173 lb (78.5 kg)   SpO2 98%   BMI 25.55 kg/m   General:   Alert,  Well-developed, pleasant and cooperative in NAD Lungs:  Clear throughout to auscultation.   Heart:  Regular rate and rhythm Abdomen:  Soft, nontender and nondistended.   Neuro/Psych:  Alert and cooperative. Normal mood and affect. A and O x 3  Harlin Rain, MD [facility]

## 2023-06-03 NOTE — Progress Notes (Signed)
A/O x 3, gd SR's, VSS, report to RN

## 2023-06-03 NOTE — Progress Notes (Signed)
Pt's states no medical or surgical changes since previsit or office visit. 

## 2023-06-04 ENCOUNTER — Telehealth: Payer: Self-pay | Admitting: *Deleted

## 2023-06-04 DIAGNOSIS — M5117 Intervertebral disc disorders with radiculopathy, lumbosacral region: Secondary | ICD-10-CM | POA: Diagnosis not present

## 2023-06-04 DIAGNOSIS — M5416 Radiculopathy, lumbar region: Secondary | ICD-10-CM | POA: Diagnosis not present

## 2023-06-04 DIAGNOSIS — M48061 Spinal stenosis, lumbar region without neurogenic claudication: Secondary | ICD-10-CM | POA: Diagnosis not present

## 2023-06-04 DIAGNOSIS — M5116 Intervertebral disc disorders with radiculopathy, lumbar region: Secondary | ICD-10-CM | POA: Diagnosis not present

## 2023-06-04 DIAGNOSIS — M79606 Pain in leg, unspecified: Secondary | ICD-10-CM | POA: Diagnosis not present

## 2023-06-04 NOTE — Telephone Encounter (Signed)
  Follow up Call-     06/03/2023    7:36 AM  Call back number  Post procedure Call Back phone  # [phone]  Permission to leave phone message Yes     Patient questions:  Do you have a fever, pain , or abdominal swelling? No. Pain Score  0 *  Have you tolerated food without any problems? Yes.    Have you been able to return to your normal activities? Yes.    Do you have any questions about your discharge instructions: Diet   No. Medications  No. Follow up visit  No.  Do you have questions or concerns about your Care? No.  Actions: * If pain score is 4 or above: No action needed, pain <4.

## 2023-06-05 DIAGNOSIS — M545 Low back pain, unspecified: Secondary | ICD-10-CM | POA: Diagnosis not present

## 2023-06-05 LAB — SURGICAL PATHOLOGY

## 2023-06-10 DIAGNOSIS — M545 Low back pain, unspecified: Secondary | ICD-10-CM | POA: Diagnosis not present

## 2023-06-12 DIAGNOSIS — M545 Low back pain, unspecified: Secondary | ICD-10-CM | POA: Diagnosis not present

## 2023-06-17 DIAGNOSIS — M545 Low back pain, unspecified: Secondary | ICD-10-CM | POA: Diagnosis not present

## 2023-06-19 DIAGNOSIS — M545 Low back pain, unspecified: Secondary | ICD-10-CM | POA: Diagnosis not present

## 2023-06-24 DIAGNOSIS — M5416 Radiculopathy, lumbar region: Secondary | ICD-10-CM | POA: Diagnosis not present

## 2023-06-24 DIAGNOSIS — M48061 Spinal stenosis, lumbar region without neurogenic claudication: Secondary | ICD-10-CM | POA: Diagnosis not present

## 2023-06-24 DIAGNOSIS — Z6826 Body mass index (BMI) 26.0-26.9, adult: Secondary | ICD-10-CM | POA: Diagnosis not present

## 2023-06-30 DIAGNOSIS — M5416 Radiculopathy, lumbar region: Secondary | ICD-10-CM | POA: Diagnosis not present

## 2023-06-30 DIAGNOSIS — R972 Elevated prostate specific antigen [PSA]: Secondary | ICD-10-CM | POA: Diagnosis not present

## 2023-07-09 DIAGNOSIS — M5416 Radiculopathy, lumbar region: Secondary | ICD-10-CM | POA: Diagnosis not present

## 2023-08-20 DIAGNOSIS — H11002 Unspecified pterygium of left eye: Secondary | ICD-10-CM | POA: Diagnosis not present

## 2023-08-20 DIAGNOSIS — E119 Type 2 diabetes mellitus without complications: Secondary | ICD-10-CM | POA: Diagnosis not present

## 2023-08-20 DIAGNOSIS — H35372 Puckering of macula, left eye: Secondary | ICD-10-CM | POA: Diagnosis not present

## 2023-08-22 ENCOUNTER — Encounter (HOSPITAL_BASED_OUTPATIENT_CLINIC_OR_DEPARTMENT_OTHER): Payer: Self-pay

## 2023-08-22 ENCOUNTER — Ambulatory Visit (HOSPITAL_BASED_OUTPATIENT_CLINIC_OR_DEPARTMENT_OTHER)
Admission: RE | Admit: 2023-08-22 | Discharge: 2023-08-22 | Disposition: A | Discharge status: Home / Self Care | Payer: BC Managed Care – PPO | Source: Ambulatory Visit | Attending: Internal Medicine

## 2023-08-22 VITALS — BP 116/82 | HR 96 | Temp 98.5°F | Resp 18

## 2023-08-22 DIAGNOSIS — J189 Pneumonia, unspecified organism: Secondary | ICD-10-CM

## 2023-08-22 MED ORDER — AMOXICILLIN 500 MG PO CAPS
1000.0000 mg | ORAL_CAPSULE | Freq: Two times a day (BID) | ORAL | 0 refills | Status: AC
Start: 2023-08-22 — End: 2023-08-27

## 2023-08-22 MED ORDER — AZITHROMYCIN 250 MG PO TABS
250.0000 mg | ORAL_TABLET | Freq: Every day | ORAL | 0 refills | Status: AC
Start: 2023-08-22 — End: ?

## 2023-08-22 NOTE — ED Provider Notes (Signed)
[facility]    CSN: [id number] Arrival date & time: 08/22/23  0851      History   Chief Complaint Chief Complaint  Patient presents with   Fever    I tested negative foe COVID yesterday using a home test. - Entered by patient    HPI Dylan Marshall is a 62 y.o. male.    Fever Associated symptoms: chills, congestion, cough, headaches, rhinorrhea and sore throat   Associated symptoms: no diarrhea, no ear pain, no nausea and no vomiting   Sick for 1 week with cough, runny stuffy nose.  Developed fever yesterday to 101, now having increased sinus pain or pressure, postnasal drip, chest congestion.  Admits coughing up foul tasting sputum.  Admits pain in his teeth. Had fatigue and chills yesterday.  Denies chest pain, shortness of breath.  Took a home COVID test was negative Denies nausea, vomiting, diarrhea, wheezing, chest pain, shortness of breath, ear pain  Past Medical History:  Diagnosis Date   Anxiety    Arthritis    feet   Depression    GERD (gastroesophageal reflux disease)    Hemorrhoid    Hypertension    Kidney stone 1994   PONV (postoperative nausea and vomiting)    PVC's (premature ventricular contractions)    workup shows benign per patient   Seasonal allergies     Patient Active Problem List   Diagnosis Date Noted   Cervical radiculopathy 05/15/2023   Clonus 05/15/2023   Hyperreflexia 05/15/2023   Prolapsed cervical intervertebral disc 05/15/2023   Low back pain 05/14/2023   Right shoulder pain 11/26/2017   Labral tear of long head of biceps tendon 05/01/2015    Past Surgical History:  Procedure Laterality Date   COLONOSCOPY  2003   HERNIA REPAIR Left 1990   SHOULDER ARTHROSCOPY WITH BICEPS TENDON REPAIR Left 05/01/2015   Procedure: SHOULDER ARTHROSCOPY debride labrum WITH BICEPS TENDON REPAIR;  Surgeon: Eldred Manges, MD;  Location: [facility];  Service: Orthopedics;  Laterality: Left;   SHOULDER SURGERY Left 1999        Home Medications    Prior to Admission medications   Medication Sig Start Date End Date Taking? Authorizing Provider  amLODipine (NORVASC) 5 MG tablet Take 5 mg by mouth daily.    [provider]  cetirizine (ZYRTEC) 10 MG chewable tablet Chew 10 mg by mouth daily.    [provider]  dicyclomine (BENTYL) 10 MG capsule Take 10 mg by mouth 4 (four) times daily as needed.    [provider]  JARDIANCE 25 MG TABS tablet Take 25 mg by mouth daily. 03/07/23   [provider]  lisinopril (PRINIVIL,ZESTRIL) 20 MG tablet Take 20 mg by mouth daily.    [provider]  pantoprazole (PROTONIX) 40 MG tablet Take 40 mg by mouth daily.    [provider]  pravastatin (PRAVACHOL) 10 MG tablet Take 10 mg by mouth at bedtime.    [provider]  Probiotic Product (PROBIOTIC DAILY PO) Take 1 tablet by mouth daily. Patient not taking: Reported on 06/03/2023    [provider]  sertraline (ZOLOFT) 50 MG tablet Take 50 mg by mouth daily.    [provider]    Family History Family History  Problem Relation Age of Onset   Colon cancer Neg Hx    Esophageal cancer Neg Hx    Rectal cancer Neg Hx    Stomach cancer Neg Hx  Social History Social History   Tobacco Use   Smoking status: Never   Smokeless tobacco: Former    Types: Chew    Quit date: 09/09/1982  Vaping Use   Vaping status: Never Used  Substance Use Topics   Alcohol use: Yes    Alcohol/week: 4.0 - 5.0 standard drinks of alcohol    Types: 4 - 5 Cans of beer per week    Comment: social   Drug use: No     Allergies   Codeine, Erythromycin, Sulfa antibiotics, Adhesive [tape], Hydrocodone-acetaminophen, and Oxycodone-acetaminophen   Review of Systems Review of Systems  Constitutional:  Positive for chills, fatigue and fever. Negative for appetite change and diaphoresis.  HENT:  Positive for congestion, rhinorrhea, sinus pressure and sore throat.  Negative for ear discharge, ear pain, trouble swallowing and voice change.   Respiratory:  Positive for cough. Negative for shortness of breath and wheezing.   Gastrointestinal:  Negative for diarrhea, nausea and vomiting.  Neurological:  Positive for headaches. Negative for light-headedness.     Physical Exam Triage Vital Signs ED Triage Vitals [08/22/23 0900]  Encounter Vitals Group     BP 116/82     Systolic BP Percentile      Diastolic BP Percentile      Pulse Rate 96     Resp 18     Temp 98.5 F (36.9 C)     Temp Source Oral     SpO2 96 %     Weight      Height      Head Circumference      Peak Flow      Pain Score 0     Pain Loc      Pain Education      Exclude from Growth Chart    No data found.  Updated Vital Signs BP 116/82 (BP Location: Right Arm)   Pulse 96   Temp 98.5 F (36.9 C) (Oral)   Resp 18   SpO2 96%   Visual Acuity Right Eye Distance:   Left Eye Distance:   Bilateral Distance:    Right Eye Near:   Left Eye Near:    Bilateral Near:     Physical Exam Vitals and nursing note reviewed.  Constitutional:      Appearance: He is not ill-appearing.  HENT:     Head: Normocephalic.     Right Ear: Tympanic membrane and ear canal normal.     Left Ear: Tympanic membrane and ear canal normal.     Nose: Congestion present. No rhinorrhea.     Mouth/Throat:     Mouth: Mucous membranes are moist.     Pharynx: Oropharynx is clear.  Cardiovascular:     Rate and Rhythm: Normal rate.     Heart sounds: Normal heart sounds.  Pulmonary:     Effort: Pulmonary effort is normal. No respiratory distress.     Breath sounds: No wheezing, rhonchi or rales.  Musculoskeletal:     Cervical back: Neck supple.  Lymphadenopathy:     Cervical: No cervical adenopathy.  Neurological:     Mental Status: He is alert and oriented to person, place, and time.  Psychiatric:        Mood and Affect: Mood normal.      UC Treatments / Results  Labs (all labs ordered  are listed, but only abnormal results are displayed) Labs Reviewed - No data to display  EKG   Radiology No results found.  Procedures Procedures (including  critical care time)  Medications Ordered in UC Medications - No data to display  Initial Impression / Assessment and Plan / UC Course  I have reviewed the triage vital signs and the nursing notes.  Pertinent labs & imaging results that were available during my care of the patient were reviewed by me and considered in my medical decision making (see chart for details).     62 year old male with URI symptoms for 1 week now having fever, aches, fatigue.  Concern for atypical pneumonia will treat with antibiotics.  Recommend OTC cough medicines and NSAIDs.  Follow-up PCP in 1 week, sooner for new or worsening symptoms or concerns Final Clinical Impressions(s) / UC Diagnoses   Final diagnoses:  None   Discharge Instructions   None    ED Prescriptions   None    PDMP not reviewed this encounter.   Meliton Rattan, [place] 08/22/23 [phone]

## 2023-08-22 NOTE — Discharge Instructions (Signed)
Over-the-counter cough medicine as discussed Over-the-counter Motrin and Tylenol as directed on the package for fever, aches and pains.  Follow-up with your doctor in 1 week, seek medical attention for worsening symptoms such as chest pain, shortness of breath, concerns

## 2023-08-22 NOTE — ED Triage Notes (Signed)
Pt c/o runny nose, sore throat, cough x 7 days and started running fever of 101 last night. Pt did take a Covid test last night and it was negative. Pt has taken delsym and mucinex with no improvement.

## 2023-09-24 DIAGNOSIS — Z85820 Personal history of malignant melanoma of skin: Secondary | ICD-10-CM | POA: Diagnosis not present

## 2023-09-24 DIAGNOSIS — R208 Other disturbances of skin sensation: Secondary | ICD-10-CM | POA: Diagnosis not present

## 2023-09-24 DIAGNOSIS — L814 Other melanin hyperpigmentation: Secondary | ICD-10-CM | POA: Diagnosis not present

## 2023-09-24 DIAGNOSIS — L821 Other seborrheic keratosis: Secondary | ICD-10-CM | POA: Diagnosis not present

## 2023-10-02 DIAGNOSIS — R29898 Other symptoms and signs involving the musculoskeletal system: Secondary | ICD-10-CM | POA: Diagnosis not present

## 2023-10-02 DIAGNOSIS — M48061 Spinal stenosis, lumbar region without neurogenic claudication: Secondary | ICD-10-CM | POA: Diagnosis not present

## 2023-10-02 DIAGNOSIS — R531 Weakness: Secondary | ICD-10-CM | POA: Diagnosis not present

## 2023-10-02 DIAGNOSIS — M47816 Spondylosis without myelopathy or radiculopathy, lumbar region: Secondary | ICD-10-CM | POA: Diagnosis not present

## 2023-10-02 DIAGNOSIS — M4807 Spinal stenosis, lumbosacral region: Secondary | ICD-10-CM | POA: Diagnosis not present

## 2023-10-23 DIAGNOSIS — K219 Gastro-esophageal reflux disease without esophagitis: Secondary | ICD-10-CM | POA: Diagnosis not present

## 2023-10-23 DIAGNOSIS — E1129 Type 2 diabetes mellitus with other diabetic kidney complication: Secondary | ICD-10-CM | POA: Diagnosis not present

## 2023-10-23 DIAGNOSIS — N529 Male erectile dysfunction, unspecified: Secondary | ICD-10-CM | POA: Diagnosis not present

## 2023-10-23 DIAGNOSIS — E782 Mixed hyperlipidemia: Secondary | ICD-10-CM | POA: Diagnosis not present

## 2023-10-23 DIAGNOSIS — I10 Essential (primary) hypertension: Secondary | ICD-10-CM | POA: Diagnosis not present

## 2023-12-05 ENCOUNTER — Other Ambulatory Visit (HOSPITAL_COMMUNITY): Payer: Self-pay | Admitting: Neurosurgery

## 2023-12-05 DIAGNOSIS — R9389 Abnormal findings on diagnostic imaging of other specified body structures: Secondary | ICD-10-CM

## 2023-12-05 DIAGNOSIS — M5416 Radiculopathy, lumbar region: Secondary | ICD-10-CM

## 2023-12-17 ENCOUNTER — Encounter (HOSPITAL_COMMUNITY)
Admission: RE | Admit: 2023-12-17 | Discharge: 2023-12-17 | Disposition: A | Discharge status: Home / Self Care | Source: Ambulatory Visit | Attending: Neurosurgery | Admitting: Neurosurgery

## 2023-12-17 DIAGNOSIS — G8929 Other chronic pain: Secondary | ICD-10-CM | POA: Diagnosis not present

## 2023-12-17 DIAGNOSIS — R9389 Abnormal findings on diagnostic imaging of other specified body structures: Secondary | ICD-10-CM | POA: Diagnosis not present

## 2023-12-17 DIAGNOSIS — M5416 Radiculopathy, lumbar region: Secondary | ICD-10-CM | POA: Insufficient documentation

## 2023-12-17 DIAGNOSIS — M549 Dorsalgia, unspecified: Secondary | ICD-10-CM | POA: Diagnosis not present

## 2023-12-17 DIAGNOSIS — R937 Abnormal findings on diagnostic imaging of other parts of musculoskeletal system: Secondary | ICD-10-CM | POA: Diagnosis not present

## 2023-12-17 MED ORDER — TECHNETIUM TC 99M MEDRONATE IV KIT
20.0000 | PACK | Freq: Once | INTRAVENOUS | Status: AC | PRN
  Administered 2023-12-17: 20 via INTRAVENOUS

## 2024-04-19 DIAGNOSIS — E1129 Type 2 diabetes mellitus with other diabetic kidney complication: Secondary | ICD-10-CM | POA: Diagnosis not present

## 2024-04-19 DIAGNOSIS — Z23 Encounter for immunization: Secondary | ICD-10-CM | POA: Diagnosis not present

## 2024-04-19 DIAGNOSIS — Z1389 Encounter for screening for other disorder: Secondary | ICD-10-CM | POA: Diagnosis not present

## 2024-04-19 DIAGNOSIS — E782 Mixed hyperlipidemia: Secondary | ICD-10-CM | POA: Diagnosis not present

## 2024-04-19 DIAGNOSIS — Z125 Encounter for screening for malignant neoplasm of prostate: Secondary | ICD-10-CM | POA: Diagnosis not present

## 2024-04-19 DIAGNOSIS — I10 Essential (primary) hypertension: Secondary | ICD-10-CM | POA: Diagnosis not present

## 2024-04-19 DIAGNOSIS — Z0000 Encounter for general adult medical examination without abnormal findings: Secondary | ICD-10-CM | POA: Diagnosis not present

## 2024-06-16 DIAGNOSIS — U071 COVID-19: Secondary | ICD-10-CM | POA: Diagnosis not present

## 2024-07-20 DIAGNOSIS — M5416 Radiculopathy, lumbar region: Secondary | ICD-10-CM | POA: Diagnosis not present
